# Patient Record
Sex: Female | Born: 1950 | Race: White | Hispanic: No | Marital: Married | Smoking: Former smoker
Health system: Southern US, Community
[De-identification: ages and names within clinical notes are randomized; demographics above are authoritative.]

## PROBLEM LIST (undated history)

## (undated) DIAGNOSIS — F32A Depression, unspecified: Secondary | ICD-10-CM

## (undated) DIAGNOSIS — F329 Major depressive disorder, single episode, unspecified: Secondary | ICD-10-CM

## (undated) DIAGNOSIS — E785 Hyperlipidemia, unspecified: Secondary | ICD-10-CM

## (undated) DIAGNOSIS — I10 Essential (primary) hypertension: Secondary | ICD-10-CM

## (undated) DIAGNOSIS — E119 Type 2 diabetes mellitus without complications: Secondary | ICD-10-CM

## (undated) DIAGNOSIS — M1990 Unspecified osteoarthritis, unspecified site: Secondary | ICD-10-CM

## (undated) HISTORY — PX: ABDOMINAL HYSTERECTOMY: SHX81

## (undated) HISTORY — DX: Hyperlipidemia, unspecified: E78.5

## (undated) HISTORY — DX: Unspecified osteoarthritis, unspecified site: M19.90

## (undated) HISTORY — DX: Type 2 diabetes mellitus without complications: E11.9

## (undated) HISTORY — PX: BACK SURGERY: SHX140

## (undated) HISTORY — DX: Depression, unspecified: F32.A

## (undated) HISTORY — DX: Essential (primary) hypertension: I10

---
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
## 2019-11-07 ENCOUNTER — Encounter: Payer: Medicare Other | Attending: Internal Medicine | Admitting: *Deleted

## 2019-11-07 ENCOUNTER — Encounter: Payer: Self-pay | Admitting: *Deleted

## 2019-11-07 ENCOUNTER — Other Ambulatory Visit: Payer: Self-pay

## 2019-11-07 VITALS — BP 150/84 | Ht 62.0 in | Wt 242.5 lb

## 2019-11-07 DIAGNOSIS — I10 Essential (primary) hypertension: Secondary | ICD-10-CM | POA: Diagnosis not present

## 2019-11-07 DIAGNOSIS — Z713 Dietary counseling and surveillance: Secondary | ICD-10-CM | POA: Insufficient documentation

## 2019-11-07 DIAGNOSIS — Z6841 Body Mass Index (BMI) 40.0 and over, adult: Secondary | ICD-10-CM | POA: Insufficient documentation

## 2019-11-07 DIAGNOSIS — F3341 Major depressive disorder, recurrent, in partial remission: Secondary | ICD-10-CM | POA: Diagnosis not present

## 2019-11-07 DIAGNOSIS — E1165 Type 2 diabetes mellitus with hyperglycemia: Secondary | ICD-10-CM | POA: Insufficient documentation

## 2019-11-07 DIAGNOSIS — E119 Type 2 diabetes mellitus without complications: Secondary | ICD-10-CM

## 2019-11-07 DIAGNOSIS — M159 Polyosteoarthritis, unspecified: Secondary | ICD-10-CM | POA: Diagnosis not present

## 2019-11-07 DIAGNOSIS — E1169 Type 2 diabetes mellitus with other specified complication: Secondary | ICD-10-CM | POA: Diagnosis not present

## 2019-11-07 NOTE — Patient Instructions (Signed)
Check blood sugars 1-2 x day before breakfast and 2 hrs after one meal 3 x week Bring blood sugar records to the next class  Call your doctor for a prescription for:  1. Meter strips (type) One Touch Verio     checking  3 times per week  2. Lancets (type) One Touch Delica Plus checking  3  times per week  Exercise:  Walk as tolerated   Eat 3 meals day,  1-2 snacks a day Space meals 4-6 hours apart Don't skip meals Limit desserts/sweets and fried foods  Make an eye doctor appointment  Return for classes on:

## 2019-11-08 ENCOUNTER — Encounter: Payer: Self-pay | Admitting: *Deleted

## 2019-11-08 NOTE — Progress Notes (Signed)
Diabetes Self-Management Education  Visit Type: First/Initial  Appt. Start Time:  1345 Appt. End Time: 6144  11/07/2019  April Richardson, identified by name and date of birth, is a 68 y.o. female with a diagnosis of Diabetes: Type 2.   ASSESSMENT  Blood pressure (!) 150/84, height 5\' 2"  (1.575 m), weight 242 lb 8 oz (110 kg). Body mass index is 44.35 kg/m.  Diabetes Self-Management Education - 11/07/19 1738      Visit Information   Visit Type  First/Initial      Initial Visit   Diabetes Type  Type 2    Are you currently following a meal plan?  No    Are you taking your medications as prescribed?  Yes    Date Diagnosed  15 years      Health Coping   How would you rate your overall health?  Fair      Psychosocial Assessment   Patient Belief/Attitude about Diabetes  Other (comment)   "I hate it"   Self-care barriers  None    Self-management support  Doctor's office;Family    Other persons present  Family Member   daughter   Patient Concerns  Nutrition/Meal planning;Medication;Monitoring;Healthy Lifestyle;Glycemic Control;Weight Control    Special Needs  None    Preferred Learning Style  Auditory    Learning Readiness  Ready    How often do you need to have someone help you when you read instructions, pamphlets, or other written materials from your doctor or pharmacy?  1 - Never    What is the last grade level you completed in school?  high school      Pre-Education Assessment   Patient understands the diabetes disease and treatment process.  Needs Instruction    Patient understands incorporating nutritional management into lifestyle.  Needs Instruction    Patient undertands incorporating physical activity into lifestyle.  Needs Instruction    Patient understands using medications safely.  Needs Instruction    Patient understands monitoring blood glucose, interpreting and using results  Needs Instruction    Patient understands prevention, detection, and  treatment of acute complications.  Needs Instruction    Patient understands prevention, detection, and treatment of chronic complications.  Needs Instruction    Patient understands how to develop strategies to address psychosocial issues.  Needs Instruction    Patient understands how to develop strategies to promote health/change behavior.  Needs Instruction      Complications   Last HgB A1C per patient/outside source  6.2 %   10/14/2019   How often do you check your blood sugar?  0 times/day (not testing)   Provided One Touch Verio Flex and instructed on use. BG upon return demonstration was 77 mg/dL at 2:55 pm - 6 hrs pp   Have you had a dilated eye exam in the past 12 months?  No    Have you had a dental exam in the past 12 months?  No    Are you checking your feet?  No      Dietary Intake   Breakfast  eats at 12 noon - cereal and milk; [place] yogurt and granola; egg and toast; occasional pancake    Snack (afternoon)  fruit - apple, banana, grapes, pineapple, mango, kiwi    Dinner  2-3 x week Chipotle, Chick-fil-a, Taco [employer]; occasional sanwich; chicken salad; chicken and occasional fish with potaotes, peas, beans, corn, rice, pasta, green beans, salad with tomatoes, cukes, cauliflower, brussel sprouts    Beverage(s)  water, coffee  Exercise   Exercise Type  ADL's      Patient Education   Previous Diabetes Education  Yes (please comment)   15 years ago   Disease state   Definition of diabetes, type 1 and 2, and the diagnosis of diabetes;Explored patient's options for treatment of their diabetes    Nutrition management   Role of diet in the treatment of diabetes and the relationship between the three main macronutrients and blood glucose level;Food label reading, portion sizes and measuring food.;Reviewed blood glucose goals for pre and post meals and how to evaluate the patients' food intake on their blood glucose level.    Physical activity and exercise   Role of exercise on  diabetes management, blood pressure control and cardiac health.    Medications  Reviewed patients medication for diabetes, action, purpose, timing of dose and side effects.    Monitoring  Taught/evaluated SMBG meter.;Purpose and frequency of SMBG.;Taught/discussed recording of test results and interpretation of SMBG.;Identified appropriate SMBG and/or A1C goals.    Chronic complications  Relationship between chronic complications and blood glucose control    Psychosocial adjustment  Identified and addressed patients feelings and concerns about diabetes      Individualized Goals (developed by patient)   Reducing Risk  Improve blood sugars Decrease medications Prevent diabetes complications Lose weight Lead a healthier lifestyle Become more fit     Outcomes   Expected Outcomes  Demonstrated interest in learning. Expect positive outcomes    Future DMSE  2 wks       Individualized Plan for Diabetes Self-Management Training:   Learning Objective:  Patient will have a greater understanding of diabetes self-management. Patient education plan is to attend individual and/or group sessions per assessed needs and concerns.   Plan:   Patient Instructions  Check blood sugars 1-2 x day before breakfast and 2 hrs after one meal 3 x week Bring blood sugar records to the next class Call your doctor for a prescription for:  1. Meter strips (type) One Touch Verio     checking  3 times per week  2. Lancets (type) One Touch Delica Plus checking  3  times per week Exercise:  Walk as tolerated  Eat 3 meals day,  1-2 snacks a day Space meals 4-6 hours apart Don't skip meals Limit desserts/sweets and fried foods Make an eye doctor appointment  Expected Outcomes:  Demonstrated interest in learning. Expect positive outcomes  Education material provided:  General Meal Planning Guidelines Simple Meal Plan Meter = One Touch Verio Flex  If problems or questions, patient to contact team via:  Sharion Settler, RN, CCM, CDE [phone]  Future DSME appointment: 2 wks  November 28, 2019 for Diabetes Class 1

## 2019-11-28 ENCOUNTER — Encounter: Payer: Medicare Other | Attending: Internal Medicine | Admitting: Dietician

## 2019-11-28 ENCOUNTER — Encounter: Payer: Self-pay | Admitting: Dietician

## 2019-11-28 ENCOUNTER — Other Ambulatory Visit: Payer: Self-pay

## 2019-11-28 VITALS — Ht 62.0 in | Wt 243.6 lb

## 2019-11-28 DIAGNOSIS — I10 Essential (primary) hypertension: Secondary | ICD-10-CM | POA: Insufficient documentation

## 2019-11-28 DIAGNOSIS — M159 Polyosteoarthritis, unspecified: Secondary | ICD-10-CM | POA: Insufficient documentation

## 2019-11-28 DIAGNOSIS — Z713 Dietary counseling and surveillance: Secondary | ICD-10-CM | POA: Diagnosis present

## 2019-11-28 DIAGNOSIS — E119 Type 2 diabetes mellitus without complications: Secondary | ICD-10-CM

## 2019-11-28 DIAGNOSIS — Z6841 Body Mass Index (BMI) 40.0 and over, adult: Secondary | ICD-10-CM | POA: Insufficient documentation

## 2019-11-28 DIAGNOSIS — E1165 Type 2 diabetes mellitus with hyperglycemia: Secondary | ICD-10-CM | POA: Diagnosis not present

## 2019-11-28 DIAGNOSIS — E1169 Type 2 diabetes mellitus with other specified complication: Secondary | ICD-10-CM | POA: Insufficient documentation

## 2019-11-28 DIAGNOSIS — F3341 Major depressive disorder, recurrent, in partial remission: Secondary | ICD-10-CM | POA: Insufficient documentation

## 2019-11-28 NOTE — Progress Notes (Signed)
Appt. Start Time: 1800 Appt. End Time: 2030  Class 1 Diabetes Overview - define DM; state own type of DM; identify functions of pancreas and insulin; define insulin deficiency vs insulin resistance  Psychosocial - identify DM as a source of stress; state the effects of stress on BG control  Nutritional Management - describe effects of food on blood glucose; identify sources of carbohydrate, protein and fat; verbalize the importance of balance meals in controlling blood glucose  Exercise - describe the effects of exercise on blood glucose and importance of regular exercise in controlling diabetes; state a plan for personal exercise; verbalize contraindications for exercise  Self-Monitoring - state importance of SMBG; use SMBG results to effectively manage diabetes; identify importance of regular HbA1C testing and goals for results  Acute Complications - recognize hyperglycemia and hypoglycemia with causes and effects; identify blood glucose results as high, low or in control; list steps in treating and preventing high and low blood glucose  Chronic Complications/Foot, Skin, Eye Dental Care - identify possible long-term complications of diabetes (retinopathy, neuropathy, nephropathy, cardiovascular disease, infections); state importance of daily self-foot exams; describe how to examine feet and what to look for; explain appropriate eye and dental care  Lifestyle Changes/Goals & Health/Community Resources - state benefits of making appropriate lifestyle changes; identify habits that need to change (meals, tobacco, alcohol); identify strategies to reduce risk factors for personal health  Pregnancy/Sexual Health - define gestational diabetes; state importance of good blood glucose control and birth control prior to pregnancy; state importance of good blood glucose control in preventing sexual problems (impotence, vaginal dryness, infections, loss of desire); state relationship of blood glucose control  and pregnancy outcome; describe risk of maternal and fetal complications  Teaching Materials Used: Class 1 Slides/Notebook Diabetes Booklet ID Card  Medic Alert/Medic ID Forms Sleep Evaluation Exercise Handout Planning a Balanced Meal Goals for Class 1

## 2019-12-05 ENCOUNTER — Encounter: Payer: Self-pay | Admitting: Dietician

## 2019-12-05 ENCOUNTER — Encounter: Payer: Medicare Other | Admitting: Dietician

## 2019-12-05 ENCOUNTER — Other Ambulatory Visit: Payer: Self-pay

## 2019-12-05 VITALS — Wt 245.3 lb

## 2019-12-05 DIAGNOSIS — Z713 Dietary counseling and surveillance: Secondary | ICD-10-CM | POA: Diagnosis not present

## 2019-12-05 DIAGNOSIS — E119 Type 2 diabetes mellitus without complications: Secondary | ICD-10-CM

## 2019-12-05 NOTE — Progress Notes (Signed)

## 2019-12-12 ENCOUNTER — Ambulatory Visit: Payer: Medicare Other

## 2020-01-09 ENCOUNTER — Other Ambulatory Visit: Payer: Self-pay

## 2020-01-09 ENCOUNTER — Telehealth: Payer: Self-pay | Admitting: *Deleted

## 2020-01-09 NOTE — Telephone Encounter (Signed)
Patient's daughter called in to front desk and cancelled Class 3 for tonight stating COVID numbers were still high. She was given dates for Class 3 in Feb and March and instructed to call back if she could come to one of these.

## 2020-02-07 ENCOUNTER — Encounter: Payer: Self-pay | Admitting: *Deleted

## 2020-07-05 NOTE — Progress Notes (Signed)
07/06/2020 2:47 PM   April Richardson June 01, 1951 [id number]  Referring provider: Barbette Reichmann, MD [address] [facility] [place],  [place] [address] Chief Complaint  Patient presents with  . Hematuria    HPI: April Richardson is a 69 y.o. female who is seen for an evaluation and management of hematuria. She presents today with her daughter.   -Patient had a follow up with her PCP on 06/15/2020.  -Labs prior on 06/11/2020 showed moderate hematuria, large leukocyte esterace, WBC 10-50, RBC 10-50, many bacteria and squamous epithelial cells. Associated urine culture showed mixed urogenital flora.  -In the past she was treated for UTIs with antibiotics and the symptoms did not completely resolve. -She has some lower back pain.     PMH: Past Medical History:  Diagnosis Date  . Arthritis   . Depression   . Diabetes mellitus without complication (HCC)   . Hyperlipidemia   . Hypertension     Surgical History: Past Surgical History:  Procedure Laterality Date  . ABDOMINAL HYSTERECTOMY    . BACK SURGERY    . CESAREAN SECTION      Home Medications:  Allergies as of 07/06/2020   No Known Allergies     Medication List       Accurate as of July 06, 2020  2:47 PM. If you have any questions, ask your nurse or doctor.        amLODipine 10 MG tablet Commonly known as: NORVASC Take 10 mg by mouth daily.   aspirin EC 81 MG tablet Take 80 mg by mouth daily as needed.   carvedilol 25 MG tablet Commonly known as: COREG Take 25 mg by mouth 2 (two) times daily.   ezetimibe 10 MG tablet Commonly known as: ZETIA Take 10 mg by mouth daily.   Ferrex 150 150 MG capsule Generic drug: iron polysaccharides Take 150 mg by mouth daily.   meloxicam 15 MG tablet Commonly known as: MOBIC Take 15 mg by mouth daily.   metFORMIN 500 MG tablet Commonly known as: GLUCOPHAGE Take 1,000 mg by mouth 2 (two) times daily with a meal.   pioglitazone  15 MG tablet Commonly known as: ACTOS Take 15 mg by mouth daily.   valsartan 80 MG tablet Commonly known as: DIOVAN Take 80 mg by mouth daily.   venlafaxine XR 150 MG 24 hr capsule Commonly known as: EFFEXOR-XR Take 150 mg by mouth daily with breakfast.   Victoza 18 MG/3ML Sopn Generic drug: liraglutide Inject 18 mg into the skin daily.   Vitamin D-1000 Max St 25 MCG (1000 UT) tablet Generic drug: Cholecalciferol Take 5,000 Units by mouth daily.       Allergies: No Known Allergies  Family History: Family History  Problem Relation Age of Onset  . Diabetes Mother     Social History:  reports that she quit smoking about 11 years ago. Her smoking use included cigarettes. She has a 15.00 pack-year smoking history. She has never used smokeless tobacco. She reports previous alcohol use. No history on file for drug use.   Physical Exam: BP (!) 147/79   Pulse 71   Ht 5\' 2"  (1.575 m)   Wt 219 lb (99.3 kg)   BMI 40.06 kg/m   Constitutional:  Alert and oriented, No acute distress. HEENT: [place] AT, moist mucus membranes.  Trachea midline, no masses. Cardiovascular: No clubbing, cyanosis, or edema. Respiratory: Normal respiratory effort, no increased work of breathing. Skin: No rashes, bruises or suspicious lesions. Neurologic: Grossly  intact, no focal deficits, moving all 4 extremities. Psychiatric: Normal mood and affect.  Urinalysis Microscopy 11-30 WBC/3-10 RBC/>10 squamous epithelial cells  Assessment & Plan:    1. Asymptomatic pyuria -Stable bothersome symptoms. -Multiple prior urinalysis showed significant squamous epithelial cells consistent with vaginal contamination.  -PVR is 12 mL.  -Screening RUS   -Daughter inquired about supplements for urinary tract health and discussed cranberry tablets and d-mannose.   [facility] [address], [address] [place], [place] [address] [phone]  I, April Richardson, am acting as a scribe  for Dr. Lorin Picket C. Faith Branan,  I have reviewed the above documentation for accuracy and completeness, and I agree with the above.   Riki Altes, MD

## 2020-07-06 ENCOUNTER — Encounter: Payer: Self-pay | Admitting: Urology

## 2020-07-06 ENCOUNTER — Ambulatory Visit: Payer: Medicare HMO | Admitting: Urology

## 2020-07-06 ENCOUNTER — Other Ambulatory Visit: Payer: Self-pay

## 2020-07-06 VITALS — BP 147/79 | HR 71 | Ht 62.0 in | Wt 219.0 lb

## 2020-07-06 DIAGNOSIS — R8281 Pyuria: Secondary | ICD-10-CM

## 2020-07-06 DIAGNOSIS — R310 Gross hematuria: Secondary | ICD-10-CM

## 2020-07-06 LAB — BLADDER SCAN AMB NON-IMAGING: SCA Result: 12

## 2020-07-07 LAB — URINALYSIS, COMPLETE
Bilirubin, UA: NEGATIVE
Glucose, UA: NEGATIVE
Nitrite, UA: NEGATIVE
RBC, UA: NEGATIVE
Specific Gravity, UA: 1.025 (ref 1.005–1.030)
Urobilinogen, Ur: 0.2 mg/dL (ref 0.2–1.0)
pH, UA: 5 (ref 5.0–7.5)

## 2020-07-07 LAB — MICROSCOPIC EXAMINATION
Bacteria, UA: NONE SEEN
Epithelial Cells (non renal): >10 /hpf — AB (ref 0–10)

## 2020-07-08 ENCOUNTER — Encounter: Payer: Self-pay | Admitting: Urology

## 2020-08-31 LAB — COLOGUARD: COLOGUARD: NEGATIVE

## 2020-11-30 ENCOUNTER — Emergency Department: Payer: Medicare HMO

## 2020-11-30 ENCOUNTER — Encounter: Payer: Self-pay | Admitting: Emergency Medicine

## 2020-11-30 ENCOUNTER — Other Ambulatory Visit: Payer: Self-pay

## 2020-11-30 ENCOUNTER — Emergency Department
Admission: EM | Admit: 2020-11-30 | Discharge: 2020-11-30 | Disposition: A | Discharge status: Home / Self Care | Payer: Medicare HMO | Attending: Emergency Medicine | Admitting: Emergency Medicine

## 2020-11-30 DIAGNOSIS — R030 Elevated blood-pressure reading, without diagnosis of hypertension: Secondary | ICD-10-CM | POA: Diagnosis not present

## 2020-11-30 DIAGNOSIS — M549 Dorsalgia, unspecified: Secondary | ICD-10-CM | POA: Insufficient documentation

## 2020-11-30 DIAGNOSIS — Z5321 Procedure and treatment not carried out due to patient leaving prior to being seen by health care provider: Secondary | ICD-10-CM | POA: Insufficient documentation

## 2020-11-30 DIAGNOSIS — R519 Headache, unspecified: Secondary | ICD-10-CM | POA: Insufficient documentation

## 2020-11-30 LAB — BASIC METABOLIC PANEL
Anion gap: 13 (ref 5–15)
BUN: 14 mg/dL (ref 8–23)
CO2: 27 mmol/L (ref 22–32)
Calcium: 10.3 mg/dL (ref 8.9–10.3)
Chloride: 98 mmol/L (ref 98–111)
Creatinine, Ser: 0.78 mg/dL (ref 0.44–1.00)
GFR, Estimated: >60 mL/min (ref >60)
Glucose, Bld: 144 mg/dL — ABNORMAL HIGH (ref 70–99)
Potassium: 3.6 mmol/L (ref 3.5–5.1)
Sodium: 138 mmol/L (ref 135–145)

## 2020-11-30 LAB — CBC
HCT: 41.3 % (ref 36.0–46.0)
Hemoglobin: 13.4 g/dL (ref 12.0–15.0)
MCH: 28.3 pg (ref 26.0–34.0)
MCHC: 32.4 g/dL (ref 30.0–36.0)
MCV: 87.1 fL (ref 80.0–100.0)
Platelets: 381 10*3/uL (ref 150–400)
RBC: 4.74 MIL/uL (ref 3.87–5.11)
RDW: 13.5 % (ref 11.5–15.5)
WBC: 15.8 10*3/uL — ABNORMAL HIGH (ref 4.0–10.5)
nRBC: 0 % (ref 0.0–0.2)

## 2020-11-30 LAB — TROPONIN I (HIGH SENSITIVITY): Troponin I (High Sensitivity): 5 ng/L (ref <18)

## 2020-11-30 NOTE — ED Triage Notes (Signed)
Pt to ED via [facility]. Pt states that she has been having back pain for 2 weeks. Last night she started feeling like she was having indigestion, pt states that she woke up with her chest hurting and feeling heavy. Pt states that during the night she got really hot. Pt states that she got up 3-4 times last night to pee. Pt states that when she woke up this morning she had a headache and was exhausted. Pt states that [facility] told her that her blood pressure was very elevated.   Pt states that she is not currently having chest pain. Pt states that the pain in her back comes and goes. Pt does c/o headache at this time.

## 2020-12-03 ENCOUNTER — Encounter: Payer: Self-pay | Admitting: Emergency Medicine

## 2020-12-03 ENCOUNTER — Emergency Department: Payer: Medicare HMO

## 2020-12-03 ENCOUNTER — Other Ambulatory Visit: Payer: Self-pay

## 2020-12-03 ENCOUNTER — Emergency Department
Admission: RE | Admit: 2020-12-03 | Discharge: 2020-12-03 | Disposition: A | Discharge status: Home / Self Care | Payer: Medicare HMO | Source: Ambulatory Visit | Attending: Family Medicine | Admitting: Family Medicine

## 2020-12-03 ENCOUNTER — Emergency Department
Admission: EM | Admit: 2020-12-03 | Discharge: 2020-12-04 | Discharge status: Against Medical Advice | Payer: Medicare HMO | Attending: Student in an Organized Health Care Education/Training Program | Admitting: Student in an Organized Health Care Education/Training Program

## 2020-12-03 ENCOUNTER — Telehealth: Payer: Self-pay | Admitting: Emergency Medicine

## 2020-12-03 ENCOUNTER — Other Ambulatory Visit: Payer: Self-pay | Admitting: Family Medicine

## 2020-12-03 DIAGNOSIS — D72829 Elevated white blood cell count, unspecified: Secondary | ICD-10-CM | POA: Insufficient documentation

## 2020-12-03 DIAGNOSIS — R509 Fever, unspecified: Secondary | ICD-10-CM | POA: Diagnosis not present

## 2020-12-03 DIAGNOSIS — R109 Unspecified abdominal pain: Secondary | ICD-10-CM | POA: Diagnosis present

## 2020-12-03 DIAGNOSIS — R1011 Right upper quadrant pain: Secondary | ICD-10-CM

## 2020-12-03 LAB — URINALYSIS, COMPLETE (UACMP) WITH MICROSCOPIC
Bacteria, UA: NONE SEEN
Bilirubin Urine: NEGATIVE
Glucose, UA: NEGATIVE mg/dL
Hgb urine dipstick: NEGATIVE
Ketones, ur: NEGATIVE mg/dL
Nitrite: NEGATIVE
Protein, ur: NEGATIVE mg/dL
Specific Gravity, Urine: 1.036 — ABNORMAL HIGH (ref 1.005–1.030)
pH: 5 (ref 5.0–8.0)

## 2020-12-03 LAB — CBC
HCT: 36.1 % (ref 36.0–46.0)
Hemoglobin: 11.7 g/dL — ABNORMAL LOW (ref 12.0–15.0)
MCH: 28 pg (ref 26.0–34.0)
MCHC: 32.4 g/dL (ref 30.0–36.0)
MCV: 86.4 fL (ref 80.0–100.0)
Platelets: 340 10*3/uL (ref 150–400)
RBC: 4.18 MIL/uL (ref 3.87–5.11)
RDW: 13.7 % (ref 11.5–15.5)
WBC: 6.5 10*3/uL (ref 4.0–10.5)
nRBC: 0 % (ref 0.0–0.2)

## 2020-12-03 LAB — COMPREHENSIVE METABOLIC PANEL
ALT: 154 U/L — ABNORMAL HIGH (ref 0–44)
AST: 185 U/L — ABNORMAL HIGH (ref 15–41)
Albumin: 3.2 g/dL — ABNORMAL LOW (ref 3.5–5.0)
Alkaline Phosphatase: 63 U/L (ref 38–126)
Anion gap: 12 (ref 5–15)
BUN: 26 mg/dL — ABNORMAL HIGH (ref 8–23)
CO2: 21 mmol/L — ABNORMAL LOW (ref 22–32)
Calcium: 9.4 mg/dL (ref 8.9–10.3)
Chloride: 97 mmol/L — ABNORMAL LOW (ref 98–111)
Creatinine, Ser: 1.01 mg/dL — ABNORMAL HIGH (ref 0.44–1.00)
GFR, Estimated: >60 mL/min (ref >60)
Glucose, Bld: 133 mg/dL — ABNORMAL HIGH (ref 70–99)
Potassium: 3.7 mmol/L (ref 3.5–5.1)
Sodium: 130 mmol/L — ABNORMAL LOW (ref 135–145)
Total Bilirubin: 2.8 mg/dL — ABNORMAL HIGH (ref 0.3–1.2)
Total Protein: 8 g/dL (ref 6.5–8.1)

## 2020-12-03 LAB — LIPASE, BLOOD: Lipase: 33 U/L (ref 11–51)

## 2020-12-03 MED ORDER — IOHEXOL 300 MG/ML  SOLN
125.0000 mL | Freq: Once | INTRAMUSCULAR | Status: AC | PRN
  Administered 2020-12-03: 125 mL via INTRAVENOUS

## 2020-12-03 NOTE — Telephone Encounter (Signed)
Called patient due to left emergency department before provider exam to inquire about condition and follow up plans. Daughter Fredric Mare says patient has appt today with pcp.

## 2020-12-03 NOTE — ED Triage Notes (Signed)
Pt via pov from home with cholecystitis. PCP ordered a CT scan this morning and it was positive for stone in duct, so she was sent here to start the process. Pt alert & oriented, nad noted.

## 2020-12-04 ENCOUNTER — Other Ambulatory Visit: Payer: Self-pay | Admitting: Family Medicine

## 2020-12-04 ENCOUNTER — Other Ambulatory Visit (HOSPITAL_COMMUNITY): Payer: Self-pay | Admitting: Family Medicine

## 2020-12-04 DIAGNOSIS — R911 Solitary pulmonary nodule: Secondary | ICD-10-CM

## 2020-12-04 DIAGNOSIS — R0602 Shortness of breath: Secondary | ICD-10-CM

## 2020-12-04 DIAGNOSIS — R1011 Right upper quadrant pain: Secondary | ICD-10-CM

## 2020-12-04 NOTE — ED Notes (Signed)
No answer when called several times from lobby 

## 2020-12-12 ENCOUNTER — Ambulatory Visit: Payer: Medicare HMO

## 2021-01-01 ENCOUNTER — Other Ambulatory Visit: Payer: Self-pay | Admitting: Pulmonary Disease

## 2021-01-01 DIAGNOSIS — R918 Other nonspecific abnormal finding of lung field: Secondary | ICD-10-CM

## 2021-01-16 ENCOUNTER — Ambulatory Visit
Admission: RE | Admit: 2021-01-16 | Discharge: 2021-01-16 | Disposition: A | Discharge status: Home / Self Care | Payer: Medicare HMO | Source: Ambulatory Visit | Attending: Pulmonary Disease | Admitting: Pulmonary Disease

## 2021-01-16 ENCOUNTER — Other Ambulatory Visit: Payer: Self-pay

## 2021-01-16 DIAGNOSIS — R918 Other nonspecific abnormal finding of lung field: Secondary | ICD-10-CM | POA: Insufficient documentation

## 2021-01-16 LAB — POCT I-STAT CREATININE: Creatinine, Ser: 1 mg/dL (ref 0.44–1.00)

## 2021-01-16 MED ORDER — IOHEXOL 300 MG/ML  SOLN
75.0000 mL | Freq: Once | INTRAMUSCULAR | Status: AC | PRN
  Administered 2021-01-16: 75 mL via INTRAVENOUS

## 2021-01-18 ENCOUNTER — Other Ambulatory Visit: Payer: Self-pay | Admitting: Internal Medicine

## 2021-01-18 ENCOUNTER — Other Ambulatory Visit (HOSPITAL_COMMUNITY): Payer: Self-pay | Admitting: Internal Medicine

## 2021-01-18 DIAGNOSIS — R0602 Shortness of breath: Secondary | ICD-10-CM

## 2021-01-18 DIAGNOSIS — K810 Acute cholecystitis: Secondary | ICD-10-CM

## 2021-01-18 DIAGNOSIS — R911 Solitary pulmonary nodule: Secondary | ICD-10-CM

## 2021-04-02 ENCOUNTER — Other Ambulatory Visit: Payer: Self-pay | Admitting: Internal Medicine

## 2021-04-02 DIAGNOSIS — R918 Other nonspecific abnormal finding of lung field: Secondary | ICD-10-CM

## 2021-04-22 ENCOUNTER — Ambulatory Visit
Admission: RE | Admit: 2021-04-22 | Discharge: 2021-04-22 | Disposition: A | Discharge status: Home / Self Care | Payer: Medicare HMO | Source: Ambulatory Visit | Attending: Internal Medicine | Admitting: Internal Medicine

## 2021-04-22 ENCOUNTER — Other Ambulatory Visit: Payer: Self-pay

## 2021-04-22 DIAGNOSIS — R918 Other nonspecific abnormal finding of lung field: Secondary | ICD-10-CM

## 2021-04-22 MED ORDER — FLUDEOXYGLUCOSE F - 18 (FDG) INJECTION: 11.2000 | Freq: Once | INTRAVENOUS | Status: DC | PRN

## 2021-05-06 ENCOUNTER — Ambulatory Visit: Admission: RE | Admit: 2021-05-06 | Payer: Medicare HMO | Source: Ambulatory Visit

## 2021-05-16 ENCOUNTER — Ambulatory Visit: Admission: RE | Admit: 2021-05-16 | Payer: Medicare HMO | Source: Ambulatory Visit

## 2021-05-27 ENCOUNTER — Other Ambulatory Visit: Payer: Self-pay

## 2021-05-27 ENCOUNTER — Ambulatory Visit
Admission: RE | Admit: 2021-05-27 | Discharge: 2021-05-27 | Disposition: A | Discharge status: Home / Self Care | Payer: Medicare HMO | Source: Ambulatory Visit | Attending: Internal Medicine | Admitting: Internal Medicine

## 2021-05-27 DIAGNOSIS — K760 Fatty (change of) liver, not elsewhere classified: Secondary | ICD-10-CM | POA: Diagnosis not present

## 2021-05-27 DIAGNOSIS — I700 Atherosclerosis of aorta: Secondary | ICD-10-CM | POA: Diagnosis not present

## 2021-05-27 DIAGNOSIS — N281 Cyst of kidney, acquired: Secondary | ICD-10-CM | POA: Insufficient documentation

## 2021-05-27 DIAGNOSIS — R918 Other nonspecific abnormal finding of lung field: Secondary | ICD-10-CM | POA: Diagnosis not present

## 2021-05-27 LAB — GLUCOSE, CAPILLARY: Glucose-Capillary: 171 mg/dL — ABNORMAL HIGH (ref 70–99)

## 2021-05-27 MED ORDER — FLUDEOXYGLUCOSE F - 18 (FDG) INJECTION
11.2000 | Freq: Once | INTRAVENOUS | Status: AC | PRN
  Administered 2021-05-27: 11.41 via INTRAVENOUS

## 2021-07-16 ENCOUNTER — Other Ambulatory Visit (HOSPITAL_COMMUNITY): Payer: Self-pay | Admitting: Internal Medicine

## 2021-07-16 ENCOUNTER — Other Ambulatory Visit: Payer: Self-pay | Admitting: Internal Medicine

## 2021-07-16 DIAGNOSIS — R911 Solitary pulmonary nodule: Secondary | ICD-10-CM

## 2021-09-27 ENCOUNTER — Ambulatory Visit: Payer: Medicare HMO | Admitting: Physical Therapy

## 2021-10-02 ENCOUNTER — Ambulatory Visit: Payer: Medicare HMO | Attending: Internal Medicine | Admitting: Physical Therapy

## 2021-10-02 ENCOUNTER — Ambulatory Visit: Payer: Medicare HMO | Admitting: Physical Therapy

## 2021-10-02 DIAGNOSIS — M25551 Pain in right hip: Secondary | ICD-10-CM | POA: Diagnosis not present

## 2021-10-02 NOTE — Therapy (Signed)
[facility] [address] [place], [address] Phone: [phone]   Fax:  [phone]  Physical Therapy Evaluation  Patient Details  Name: April Richardson MRN: [id number] Date of Birth: 02-Apr-1951 No data recorded  Encounter Date: 10/02/2021   PT End of Session - 10/02/21 1051     Visit Number 1    Number of Visits 17    Date for PT Re-Evaluation 11/27/21    Authorization - Visit Number 1    Progress Note Due on Visit 10    PT Start Time 0950    PT Stop Time 1030    PT Time Calculation (min) 40 min    Equipment Utilized During Treatment Gait belt    Activity Tolerance Patient tolerated treatment well    Behavior During Therapy WFL for tasks assessed/performed             Past Medical History:  Diagnosis Date   Arthritis    Depression    Diabetes mellitus without complication (HCC)    Hyperlipidemia    Hypertension     Past Surgical History:  Procedure Laterality Date   ABDOMINAL HYSTERECTOMY     BACK SURGERY     CESAREAN SECTION      There were no vitals filed for this visit.    Subjective Assessment - 10/02/21 0951     Subjective April Richardson is 70 y.o. female presenting to therapy with c/o R hip pain for the past 2 month. She reports increase in pain after visting her daughter and having to negotiate a lot of stairs. She reports having limitations with walking, standing, lifting, sitting, and balance. 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 She reports avoiding stairs, curbs, and laying on R hip due to aggravation. She reports her pain is 0/10 best and 6/10 at worst. She reports utilizing tylenol to alleviate her pain. Her goal for PT is to reduce her R hip pain and negotiate a flight of steps in order to visit her daughter. She denies falling in the last 6 months, any unexplained weight fluctuation, saddle paresthesia,  or unrelenting night pain at this time.    Pertinent History DM, HTN    Limitations Walking;Standing    How long can you sit comfortably? n/a    How long can you stand comfortably?    How long can you walk comfortably? 10 min    Diagnostic tests xrays    Patient Stated Goals pt reports wanting to climb a flight of stairs    Currently in Pain? Yes    Pain Score 0-No pain    Pain Location Hip    Pain Orientation Right    Pain Descriptors / Indicators Aching    Pain Type Chronic pain    Pain Onset More than a month ago    Pain Frequency Intermittent    Aggravating Factors  stairs, walking, standing    Pain Relieving Factors tylenol    Effect of Pain on Daily Activities limits ability to perform shopping and occupation              Lumbar AROM   WFL excepts flexion limited approximately 25%      HIP MMT R/L Flex 4- /4 Ext: 3/3 within available range  ADD: 4/4 ABD 4-/4- painful  IR: 3+/3+ painful within available range ER 3/3 painful within available range   Knee MMT R/L Flex 4-/4 Ext  4-/4   Hip AROM/PROM   Limited in all planes bilat;  primarily R hip  Flex approximately 25% limited IR approximately 75% limited painful  Ext approximately 90% limited  PROM > AROM in all planes   Knee AROM   WFL   PAM: A-P pain full grade 3 mobilization; LAD deferred   Special tests:   FABER: + FADIR: + Scour: +     Test & Measures:   5XSTS: 37 sec : deferred TUG: 23sec  Single leg stance: deferred     Observation:  Gait: wide BOS, decreased stance time R LE, L Trendelenburg,  L LE in ER through stance phase     Posture: increased lumbar lordosis, thoracic kyphosis, round bilateral shoulder, forward head   Sensation: appears intact bilaterally       Objective measurements completed on examination: See above findings.     Ther-Ex Seated Hip ABD RTB  3 x 8-10 reps Sit to stands 3 x 6-8 reps      PT reviewed the following HEP with patient  with patient able to demonstrate a set of the following with min cuing for correction needed. PT educated patient on parameters of therex (how/when to inc/decrease intensity, frequency, rep/set range, stretch hold time, and purpose of therex) with verbalized understanding.             PT Education - 10/02/21 1033     Education Details Patient was educated on diagnosis, anatomy and pathology involved, prognosis, role of PT, and was given an HEP, demonstrating exercise with proper form following verbal and tactile cues, and was given a paper hand out to continue exercise at home. Pt was educated on and agreed to plan of care.    Methods Explanation;Demonstration    Comprehension Verbalized understanding;Returned demonstration              PT Short Term Goals - 10/02/21 1033       PT SHORT TERM GOAL #1   Title Pt will demonstrate independence with HEP to improve hip function for increased ability to participate with ADLs    Baseline HEP given    Time 4    Period Weeks    Status New    Target Date 10/30/21               PT Long Term Goals - 10/02/21 1036       PT LONG TERM GOAL #1   Title Patient will increase FOTO score to 56 to demonstrate predicted increase in functional mobility to complete ADLs    Baseline 10/02/21: 33    Time 8    Period Weeks    Status New    Target Date 11/27/21      PT LONG TERM GOAL #2   Title Pt will perform 5XSTS within 30 seconds without UE support in order to demonstrate improved LE strength and decrease the likelihood of falling.    Baseline 10/02/21:37 sec with BUE    Time 8    Period Weeks    Status New    Target Date 11/27/21      PT LONG TERM GOAL #3   Title Pt will independently negotiate 4 6" stairs with reciprocal gait pattern, single UE support, pain no greater than 4/10 to demonstrate increased LE strength and decreased pain with functional activity.    Baseline 10/02/21 step to pattern with single UE support    Time  8    Period Weeks    Status New  Target Date 11/27/21      PT LONG TERM GOAL #4   Title Pt will increase gait speed in to >0.63m/s to demonstrate appropriate speed for limited community ambulation    Baseline deferred    Time 8    Period Weeks    Status New    Target Date 11/27/21      PT LONG TERM GOAL #5   Title Pt will improve single leg stance to 10 sec or greater in order to demonstrate increased LE strength, increased balance, and decrease likelihood of falling    Baseline deferred    Time 8    Period Weeks    Status New    Target Date 11/27/21                    Plan - 10/02/21 1118     Clinical Impression Statement Patient is a 70 y/o female with hx of OA, presenting today with R hip pain. Patient presents with impairments in LE strength, bilat hip A/PROM, abnormal gait, pain and decreased balance. Activity limitations in stair negotiation, prolonged walking and standing prohibiting participation in completing ADLs, occupational duties, and  recreational/community activities. Patient will benefit from skilled PT to address these impairments to reduce risk of falling.    Personal Factors and Comorbidities Age;Fitness;Sex;Comorbidity 2    Comorbidities DM, HTN, Obesity    Examination-Activity Limitations Transfers;Locomotion Level;Stairs;Stand;Carry    Examination-Participation Restrictions Occupation;Community Activity;Shop    Stability/Clinical Decision Making Stable/Uncomplicated    Clinical Decision Making Low    Rehab Potential Fair    PT Frequency 2x / week    PT Duration 8 weeks    PT Treatment/Interventions ADLs/Self Care Home Management;Cryotherapy;Electrical Stimulation;Moist Heat;Traction;Gait training;Stair training;Functional mobility training;Therapeutic activities;Neuromuscular re-education;Balance training;Therapeutic exercise;Patient/family education;Manual techniques;Passive range of motion;Joint Manipulations;Energy conservation;Dry  needling;Taping    PT Next Visit Plan manual therapy( LAD), , SLS    PT Home Exercise Plan seated hip abd, sit to stands    Consulted and Agree with Plan of Care Patient             Patient will benefit from skilled therapeutic intervention in order to improve the following deficits and impairments:  Abnormal gait, Decreased balance, Decreased endurance, Decreased mobility, Difficulty walking, Hypomobility, Obesity, Pain, Postural dysfunction, Impaired flexibility, Decreased strength, Decreased activity tolerance, Decreased range of motion, Improper body mechanics  Visit Diagnosis: Pain in right hip     Problem List There are no problems to display for this patient.   Hilda Lias DPT  Romilda Joy, SPT  Hilda Lias, PT 10/03/2021, 2:40 PM  [facility] PHYS[facility] AND SPORTS MEDICINE [address], [place], [address] Phone: [phone]   Fax:  [phone]  Name: April Richardson MRN: [id number] Date of Birth: 08/18/1951

## 2021-10-04 ENCOUNTER — Encounter: Payer: Self-pay | Admitting: Physical Therapy

## 2021-10-04 ENCOUNTER — Ambulatory Visit: Payer: Medicare HMO | Admitting: Physical Therapy

## 2021-10-04 ENCOUNTER — Other Ambulatory Visit: Payer: Self-pay

## 2021-10-04 DIAGNOSIS — M25551 Pain in right hip: Secondary | ICD-10-CM

## 2021-10-04 NOTE — Therapy (Signed)
[facility]INE [address], [place], [address] Phone: [phone]   Fax:  [phone]  Physical Therapy Treatment  Patient Details  Name: Alleyah Twombly MRN: [id number] Date of Birth: [birth year] No data recorded  Encounter Date: 10/04/2021     Past Medical History:  Diagnosis Date   Arthritis    Depression    Diabetes mellitus without complication (HCC)    Hyperlipidemia    Hypertension     Past Surgical History:  Procedure Laterality Date   ABDOMINAL HYSTERECTOMY     BACK SURGERY     CESAREAN SECTION      There were no vitals filed for this visit.    Therex    Nu Step L1 x 5 min Le only   : 10.6 sec = 0.94 m/s  Single Leg stance: <5 sec BLE  Seated ABD RTB 3 x 10 reps with cues for foot placement   Sit to Stand 1 x 6 reps with heavy UE use   Seated knee ext 1 x 6 reps #5 with   *attempts to arise from elevated mat table without UE use                                   PT Short Term Goals - 10/02/21 1033       PT SHORT TERM GOAL #1   Title Pt will demonstrate independence with HEP to improve hip function for increased ability to participate with ADLs    Baseline HEP given    Time 4    Period Weeks    Status New    Target Date 10/30/21               PT Long Term Goals - 10/02/21 1036       PT LONG TERM GOAL #1   Title Patient will increase FOTO score to 56 to demonstrate predicted increase in functional mobility to complete ADLs    Baseline 10/02/21: 33    Time 8    Period Weeks    Status New    Target Date 11/27/21      PT LONG TERM GOAL #2   Title Pt will perform 5XSTS within 30 seconds without UE support in order to demonstrate improved LE strength and decrease the likelihood of falling.    Baseline 10/02/21:37 sec with BUE    Time 8    Period Weeks    Status New    Target Date 11/27/21      PT LONG TERM GOAL #3    Title Pt will independently negotiate 4 6" stairs with reciprocal gait pattern, single UE support, pain no greater than 4/10 to demonstrate increased LE strength and decreased pain with functional activity.    Baseline 10/02/21 step to pattern with single UE support    Time 8    Period Weeks    Status New    Target Date 11/27/21      PT LONG TERM GOAL #4   Title Pt will increase gait speed in to >0.38m/s to demonstrate appropriate speed for limited community ambulation    Baseline deferred    Time 8    Period Weeks    Status New    Target Date 11/27/21      PT LONG TERM GOAL #5   Title Pt will improve single leg stance to 10 sec or greater  in order to demonstrate increased LE strength, increased balance, and decrease likelihood of falling    Baseline deferred    Time 8    Period Weeks    Status New    Target Date 11/27/21                     Patient will benefit from skilled therapeutic intervention in order to improve the following deficits and impairments:  Abnormal gait, Decreased balance, Decreased endurance, Decreased mobility, Difficulty walking, Hypomobility, Obesity, Pain, Postural dysfunction, Impaired flexibility, Decreased strength, Decreased activity tolerance, Decreased range of motion, Improper body mechanics  Visit Diagnosis: Pain in right hip     Problem List There are no problems to display for this patient.   Hilda Lias DPT Romilda Joy, SPT  Hilda Lias, PT 10/06/2021, 9:15 PM  [facility] PHYSICAL AND SPORTS MEDICINE [address], [place], [address] Phone: [phone]   Fax:  [phone]  Name: Stefan Karen MRN: [id number] Date of Birth: 24-Oct-1951

## 2021-10-08 ENCOUNTER — Ambulatory Visit: Payer: Medicare HMO | Admitting: Physical Therapy

## 2021-10-11 ENCOUNTER — Encounter: Payer: Self-pay | Admitting: Physical Therapy

## 2021-10-11 ENCOUNTER — Other Ambulatory Visit: Payer: Self-pay

## 2021-10-11 ENCOUNTER — Ambulatory Visit: Payer: Medicare HMO | Admitting: Physical Therapy

## 2021-10-11 DIAGNOSIS — M25551 Pain in right hip: Secondary | ICD-10-CM

## 2021-10-11 NOTE — Therapy (Signed)
[facility]ORTS MEDICINE [address] [place], [address] Phone: [phone]   Fax:  [phone]  Physical Therapy Treatment  Patient Details  Name: April Richardson MRN: [id number] Date of Birth: March 25, 1951 No data recorded  Encounter Date: 10/11/2021   PT End of Session - 10/11/21 1030     Visit Number 3    Number of Visits 17    Date for PT Re-Evaluation 11/27/21    Authorization - Visit Number 3    Progress Note Due on Visit 10    PT Start Time 1020    PT Stop Time 1058    PT Time Calculation (min) 38 min    Equipment Utilized During Treatment Gait belt    Activity Tolerance Patient tolerated treatment well    Behavior During Therapy WFL for tasks assessed/performed             Past Medical History:  Diagnosis Date   Arthritis    Depression    Diabetes mellitus without complication (HCC)    Hyperlipidemia    Hypertension     Past Surgical History:  Procedure Laterality Date   ABDOMINAL HYSTERECTOMY     BACK SURGERY     CESAREAN SECTION      There were no vitals filed for this visit.   Subjective Assessment - 10/11/21 1024     Subjective Pt reports her R hip pain is 0/10 on NPRs . She reports active participation with her HEP and feels shes making functional progress.    Pertinent History April Richardson is 70 y.o. female presenting to therapy with c/o R hip pain for the past 2 month. She reports increase in pain after visting her daughter and having to negotiate a lot of stairs. She reports having limitations with walking, standing, lifting, sitting, and balance. 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 She reports avoiding stairs, curbs, and laying on R hip due to aggravation. She reports her pain is 0/10 best and 6/10 at worst. She reports utilizing tylenol to alleviate her pain. Her goal for PT is to reduce her R hip pain and  negotiate a flight of steps in order to visit her daughter. She denies falling in the last 6 months, any unexplained weight fluctuation, saddle paresthesia, or unrelenting night pain at this time.    Limitations Walking;Standing    How long can you sit comfortably? n/a    How long can you stand comfortably?    How long can you walk comfortably? 10 min    Diagnostic tests xrays    Patient Stated Goals pt reports wanting to climb a flight of stairs               Therex      Nu Step L2 x 5 min Seat 6 LE only   Mini Squat with swiss ball on wall 2 x 6-8 reps with cues for depth    Side lying hip ABD 2 x 6-810 reps with tactile and verbal cues for proper technique with good carry over   Alternating toe tapping onto 8 inch step 3 x 30 sec without UE support for balance and endurance               PT Education - 10/11/21 1157     Education Details therex form/technique    Person(s) Educated Patient    Methods Explanation;Demonstration    Comprehension Verbalized understanding;Returned  demonstration              PT Short Term Goals - 10/02/21 1033       PT SHORT TERM GOAL #1   Title Pt will demonstrate independence with HEP to improve hip function for increased ability to participate with ADLs    Baseline HEP given    Time 4    Period Weeks    Status New    Target Date 10/30/21               PT Long Term Goals - 10/02/21 1036       PT LONG TERM GOAL #1   Title Patient will increase FOTO score to 56 to demonstrate predicted increase in functional mobility to complete ADLs    Baseline 10/02/21: 33    Time 8    Period Weeks    Status New    Target Date 11/27/21      PT LONG TERM GOAL #2   Title Pt will perform 5XSTS within 30 seconds without UE support in order to demonstrate improved LE strength and decrease the likelihood of falling.    Baseline 10/02/21:37 sec with BUE    Time 8    Period Weeks    Status New    Target Date 11/27/21       PT LONG TERM GOAL #3   Title Pt will independently negotiate 4 6" stairs with reciprocal gait pattern, single UE support, pain no greater than 4/10 to demonstrate increased LE strength and decreased pain with functional activity.    Baseline 10/02/21 step to pattern with single UE support    Time 8    Period Weeks    Status New    Target Date 11/27/21      PT LONG TERM GOAL #4   Title Pt will increase gait speed in to >0.9m/s to demonstrate appropriate speed for limited community ambulation    Baseline deferred    Time 8    Period Weeks    Status New    Target Date 11/27/21      PT LONG TERM GOAL #5   Title Pt will improve single leg stance to 10 sec or greater in order to demonstrate increased LE strength, increased balance, and decrease likelihood of falling    Baseline deferred    Time 8    Period Weeks    Status New    Target Date 11/27/21                   Plan - 10/11/21 1158     Clinical Impression Statement PT continued LE strengthening and balance training this session. Pt tolerated therex with reports of increased pain throughtout session. Pt is improving with LE strengthening and endurance, however, does continues to need frequent rest breaks. Pt will continue to benefit from skilled PT in order to address impairments and achieve set goals.    Personal Factors and Comorbidities Age;Fitness;Sex;Comorbidity 2    Comorbidities DM, HTN, Obesity    Examination-Activity Limitations Transfers;Locomotion Level;Stairs;Stand;Carry    Examination-Participation Restrictions Occupation;Community Activity;Shop    Stability/Clinical Decision Making Stable/Uncomplicated    Clinical Decision Making Low    Rehab Potential Fair    PT Frequency 2x / week    PT Duration 8 weeks    PT Treatment/Interventions ADLs/Self Care Home Management;Cryotherapy;Electrical Stimulation;Moist Heat;Traction;Gait training;Stair training;Functional mobility training;Therapeutic  activities;Neuromuscular re-education;Balance training;Therapeutic exercise;Patient/family education;Manual techniques;Passive range of motion;Joint Manipulations;Energy conservation;Dry needling;Taping    PT Next Visit  Plan manual therapy( LAD)    PT Home Exercise Plan seated hip abd, sit to stands    Consulted and Agree with Plan of Care Patient             Patient will benefit from skilled therapeutic intervention in order to improve the following deficits and impairments:  Abnormal gait, Decreased balance, Decreased endurance, Decreased mobility, Difficulty walking, Hypomobility, Obesity, Pain, Postural dysfunction, Impaired flexibility, Decreased strength, Decreased activity tolerance, Decreased range of motion, Improper body mechanics  Visit Diagnosis: Pain in right hip     Problem List There are no problems to display for this patient.    Hilda Lias DPT Romilda Joy, SPT  Hilda Lias, PT 10/11/2021, 12:04 PM  [facility] [facility] PHYSICAL AND SPORTS MEDICINE [address] [place], [address] Phone: [phone]   Fax:  [phone]  Name: Jyrah Blye MRN: [id number] Date of Birth: 07-29-1951

## 2021-10-15 ENCOUNTER — Ambulatory Visit: Payer: Medicare HMO | Admitting: Physical Therapy

## 2021-10-18 ENCOUNTER — Ambulatory Visit: Payer: Medicare HMO | Admitting: Physical Therapy

## 2021-10-18 ENCOUNTER — Other Ambulatory Visit: Payer: Self-pay

## 2021-10-18 DIAGNOSIS — M25551 Pain in right hip: Secondary | ICD-10-CM

## 2021-10-18 NOTE — Therapy (Signed)
[facility] PHYS[facility] SPORTS MEDICINE [address], [place], [address] Phone: [phone]   Fax:  [phone]  Physical Therapy Treatment  Patient Details  Name: April Richardson MRN: [id number] Date of Birth: 04-25-1951 No data recorded  Encounter Date: 10/18/2021   PT End of Session - 10/18/21 1024     Visit Number 4    Number of Visits 17    Date for PT Re-Evaluation 11/27/21    Authorization - Visit Number 4    Progress Note Due on Visit 10    PT Start Time 1016    PT Stop Time 1054    PT Time Calculation (min) 38 min    Equipment Utilized During Treatment Gait belt    Activity Tolerance Patient tolerated treatment well    Behavior During Therapy WFL for tasks assessed/performed             Past Medical History:  Diagnosis Date   Arthritis    Depression    Diabetes mellitus without complication (HCC)    Hyperlipidemia    Hypertension     Past Surgical History:  Procedure Laterality Date   ABDOMINAL HYSTERECTOMY     BACK SURGERY     CESAREAN SECTION      There were no vitals filed for this visit.   Subjective Assessment - 10/18/21 1020     Subjective Pt denies any R hip pain this visit. She reports active participation with her HEP and feels shes making progress.    Pertinent History April Richardson is 70 y.o. female presenting to therapy with c/o R hip pain for the past 2 month. She reports increase in pain after visting her daughter and having to negotiate a lot of stairs. She reports having limitations with walking, standing, lifting, sitting, and balance. 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 She reports avoiding stairs, curbs, and laying on R hip due to aggravation. She reports her pain is 0/10 best and 6/10 at worst. She reports utilizing tylenol to alleviate her pain. Her goal for PT is to reduce her R hip pain and negotiate a flight of  steps in order to visit her daughter. She denies falling in the last 6 months, any unexplained weight fluctuation, saddle paresthesia, or unrelenting night pain at this time.    Limitations Walking;Standing    How long can you sit comfortably? n/a    How long can you stand comfortably?    How long can you walk comfortably? 10 min    Diagnostic tests xrays    Patient Stated Goals pt reports wanting to climb a flight of stairs             Therex      Nu Step L2 x 5 min Seat 6 LE only >80SPM   Mini Squat with swiss ball on wall 2 x 10 reps with cues for depth and WB through R LE  Tandem Stance 3 x 20 sec each LE   Single Leg Stance each LE 20 sec without UE support each LE with CGA   Standing hip ABD 2 x 7-8 reps with tactile and verbal cues for proper technique with good carry over    Alternating toe tapping onto 8 inch step 2 x 30 sec without UE support for balance and endurance  PT Education - 10/18/21 1055     Education Details therex form/technique    Person(s) Educated Patient    Methods Explanation;Demonstration    Comprehension Returned demonstration;Verbalized understanding              PT Short Term Goals - 10/02/21 1033       PT SHORT TERM GOAL #1   Title Pt will demonstrate independence with HEP to improve hip function for increased ability to participate with ADLs    Baseline HEP given    Time 4    Period Weeks    Status New    Target Date 10/30/21               PT Long Term Goals - 10/02/21 1036       PT LONG TERM GOAL #1   Title Patient will increase FOTO score to 56 to demonstrate predicted increase in functional mobility to complete ADLs    Baseline 10/02/21: 33    Time 8    Period Weeks    Status New    Target Date 11/27/21      PT LONG TERM GOAL #2   Title Pt will perform 5XSTS within 30 seconds without UE support in order to demonstrate improved LE strength and decrease  the likelihood of falling.    Baseline 10/02/21:37 sec with BUE    Time 8    Period Weeks    Status New    Target Date 11/27/21      PT LONG TERM GOAL #3   Title Pt will independently negotiate 4 6" stairs with reciprocal gait pattern, single UE support, pain no greater than 4/10 to demonstrate increased LE strength and decreased pain with functional activity.    Baseline 10/02/21 step to pattern with single UE support    Time 8    Period Weeks    Status New    Target Date 11/27/21      PT LONG TERM GOAL #4   Title Pt will increase gait speed in to >0.16m/s to demonstrate appropriate speed for limited community ambulation    Baseline deferred    Time 8    Period Weeks    Status New    Target Date 11/27/21      PT LONG TERM GOAL #5   Title Pt will improve single leg stance to 10 sec or greater in order to demonstrate increased LE strength, increased balance, and decrease likelihood of falling    Baseline deferred    Time 8    Period Weeks    Status New    Target Date 11/27/21                   Plan - 10/18/21 1056     Clinical Impression Statement PT continued LE strengthening and balance training this session. Pt tolerated therex with no reports of increased pain throughtout session. Pt is improving with LE strengthening evidence by sit to stand without UE use. Pt  does continue to need frequent rest breaks. Pt will continue to benefit from skilled PT in order to address impairments and achieve set goals.    Personal Factors and Comorbidities Age;Fitness;Sex;Comorbidity 2    Comorbidities DM, HTN, Obesity    Examination-Activity Limitations Transfers;Locomotion Level;Stairs;Stand;Carry    Examination-Participation Restrictions Occupation;Community Activity;Shop    Stability/Clinical Decision Making Stable/Uncomplicated    Clinical Decision Making Low    Rehab Potential Fair    PT Frequency 2x / week  PT Duration 8 weeks    PT Treatment/Interventions  ADLs/Self Care Home Management;Cryotherapy;Electrical Stimulation;Moist Heat;Traction;Gait training;Stair training;Functional mobility training;Therapeutic activities;Neuromuscular re-education;Balance training;Therapeutic exercise;Patient/family education;Manual techniques;Passive range of motion;Joint Manipulations;Energy conservation;Dry needling;Taping    PT Next Visit Plan manual therapy( LAD)    PT Home Exercise Plan seated hip abd, sit to stands    Consulted and Agree with Plan of Care Patient             Patient will benefit from skilled therapeutic intervention in order to improve the following deficits and impairments:  Abnormal gait, Decreased balance, Decreased endurance, Decreased mobility, Difficulty walking, Hypomobility, Obesity, Pain, Postural dysfunction, Impaired flexibility, Decreased strength, Decreased activity tolerance, Decreased range of motion, Improper body mechanics  Visit Diagnosis: Pain in right hip     Problem List There are no problems to display for this patient.  Hilda Lias DPT Hilda Lias, PT 10/18/2021, 11:45 AM  [place] [facility] PHYSICAL AND SPORTS MEDICINE [address], [place], [address] Phone: [phone]   Fax:  [phone]  Name: April Richardson MRN: [id number] Date of Birth: December 23, 1950

## 2021-10-22 ENCOUNTER — Encounter: Payer: Medicare HMO | Admitting: Physical Therapy

## 2021-10-25 ENCOUNTER — Ambulatory Visit: Payer: Medicare HMO | Attending: Internal Medicine | Admitting: Physical Therapy

## 2021-10-25 ENCOUNTER — Encounter: Payer: Self-pay | Admitting: Physical Therapy

## 2021-10-25 ENCOUNTER — Other Ambulatory Visit: Payer: Self-pay

## 2021-10-25 DIAGNOSIS — M25551 Pain in right hip: Secondary | ICD-10-CM | POA: Diagnosis present

## 2021-10-25 NOTE — Therapy (Signed)
[facility] PH[facility]ICINE [address] [place], [address] Phone: [phone]   Fax:  [phone]  Physical Therapy Treatment Discharge Summary Reporting Period 10/02/21-10/25/21  Patient Details  Name: April Richardson MRN: [id number] Date of Birth: 1951/12/18 No data recorded  Encounter Date: 10/25/2021   PT End of Session - 10/25/21 1111     Visit Number 5    Number of Visits 17    Date for PT Re-Evaluation 11/27/21    Authorization - Visit Number 5    Progress Note Due on Visit 10    PT Start Time 1102    PT Stop Time 1140    PT Time Calculation (min) 38 min    Equipment Utilized During Treatment Gait belt    Activity Tolerance Patient tolerated treatment well    Behavior During Therapy WFL for tasks assessed/performed             Past Medical History:  Diagnosis Date   Arthritis    Depression    Diabetes mellitus without complication (HCC)    Hyperlipidemia    Hypertension     Past Surgical History:  Procedure Laterality Date   ABDOMINAL HYSTERECTOMY     BACK SURGERY     CESAREAN SECTION      There were no vitals filed for this visit.   Subjective Assessment - 10/25/21 1108     Subjective Pt denies any R hip pain this visit. She reports active participation with her HEP and feels shes has made 60-70% improvement since starting PT.    Pertinent History April Richardson is 70 y.o. female presenting to therapy with c/o R hip pain for the past 2 month. She reports increase in pain after visting her daughter and having to negotiate a lot of stairs. She reports having limitations with walking, standing, lifting, sitting, and balance. 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 She reports avoiding stairs, curbs, and laying on R hip due to aggravation. She reports her pain is 0/10 best and 6/10 at worst. She reports utilizing tylenol to alleviate  her pain. Her goal for PT is to reduce her R hip pain and negotiate a flight of steps in order to visit her daughter. She denies falling in the last 6 months, any unexplained weight fluctuation, saddle paresthesia, or unrelenting night pain at this time.    Limitations Walking;Standing    How long can you sit comfortably? n/a    How long can you stand comfortably?    How long can you walk comfortably? 10 min    Diagnostic tests xrays    Patient Stated Goals pt reports wanting to climb a flight of stairs            Therex   Recumbent Bike L1 x 4 min for gentle LE strengthening    Access Code: [email or web address]  Exercises Mini Squat with Counter Support - 1 x daily - 2-3 x weekly - 3 sets - 8-10 reps Side Stepping with Resistance at Ankles and Counter Support - 1 x daily - 2-3 x weekly - 3 sets - 8-10 reps Recumbent Bike - 1 x daily - 3-4 x weekly - 3 sets Standing March with Unilateral Counter Support - 1 x daily - 2-3 x weekly - 3 sets - 8-10 reps Supine Bridge - 1 x daily - 2-3 x weekly - 3 sets - 8-10 reps Standing Gluteal Stretch on Chair -  1 x daily - 7 x weekly - 3 sets - 10 reps Seated Piriformis Stretch - 1 x daily - 7 x weekly - 3 reps - 30 sec hold  5XSTS: 12 sec without UE support  Stair negotiation: reciprocal gait, single UE support, wit reported no pain  : 1.0 m/s      *PT reviewed exercises with patient and expressed importance of continuing exercises post PT. Patient achieved all set goals except single leg stance time (not tested)                     PT Education - 10/25/21 1111     Education Details therex form/technique    Person(s) Educated Patient    Methods Explanation;Demonstration    Comprehension Verbalized understanding;Returned demonstration              PT Short Term Goals - 10/02/21 1033       PT SHORT TERM GOAL #1   Title Pt will demonstrate independence with HEP to improve hip function for increased ability to  participate with ADLs    Baseline HEP given    Time 4    Period Weeks    Status New    Target Date 10/30/21               PT Long Term Goals - 10/25/21 1113       PT LONG TERM GOAL #1   Title Patient will increase FOTO score to 56 to demonstrate predicted increase in functional mobility to complete ADLs    Baseline 10/02/21: 33 10/25/21:99    Time 8    Period Weeks    Status Achieved      PT LONG TERM GOAL #2   Title Pt will perform 5XSTS within 30 seconds without UE support in order to demonstrate improved LE strength and decrease the likelihood of falling.    Baseline 10/02/21:37 sec with BUE 10/25/21: 12 sec without UE Support    Time 8    Status Achieved      PT LONG TERM GOAL #3   Title Pt will independently negotiate 4 6" stairs with reciprocal gait pattern, single UE support, pain no greater than 4/10 to demonstrate increased LE strength and decreased pain with functional activity.    Baseline 10/02/21 step to pattern with single UE support 10/25/21:  step to pattern with single UE support with no pain    Time 8    Period Weeks    Status Achieved      PT LONG TERM GOAL #4   Title Pt will increase gait speed in to >0.103m/s to demonstrate appropriate speed for limited community ambulation    Baseline 10/25/21: 40m/s    Time 8    Period Weeks    Status Achieved      PT LONG TERM GOAL #5   Title Pt will improve single leg stance to 10 sec or greater in order to demonstrate increased LE strength, increased balance, and decrease likelihood of falling    Baseline deferred    Period Weeks    Status Unable to assess                   Plan - 10/25/21 1149     Clinical Impression Statement Patient is aware of discharge and prognosis. Patient verbalized understanding of discharge recommendations, achieved progress, and importance of continuation of exercise post PT.Overall, patient has improved LE strength, decreased R hip pain, ability to negotiate  stairs,  and is indepenendent with HEP. An HEP was provided and verbally reviewed with patient able to verbalize understanding of exercises and prescription. Patient has achieved all set goals except single leg balance. End PT POC this episode.    Personal Factors and Comorbidities Age;Fitness;Sex;Comorbidity 2    Comorbidities DM, HTN, Obesity    Examination-Activity Limitations Transfers;Locomotion Level;Stairs;Stand;Carry    Examination-Participation Restrictions Occupation;Community Activity;Shop    Stability/Clinical Decision Making Stable/Uncomplicated    Clinical Decision Making Low    Rehab Potential Fair    PT Frequency 2x / week    PT Duration 8 weeks    PT Treatment/Interventions ADLs/Self Care Home Management;Cryotherapy;Electrical Stimulation;Moist Heat;Traction;Gait training;Stair training;Functional mobility training;Therapeutic activities;Neuromuscular re-education;Balance training;Therapeutic exercise;Patient/family education;Manual techniques;Passive range of motion;Joint Manipulations;Energy conservation;Dry needling;Taping    PT Next Visit Plan d/c'd    PT Home Exercise Plan Access Code: [email or web address]    Consulted and Agree with Plan of Care Patient             Patient will benefit from skilled therapeutic intervention in order to improve the following deficits and impairments:  Abnormal gait, Decreased balance, Decreased endurance, Decreased mobility, Difficulty walking, Hypomobility, Obesity, Pain, Postural dysfunction, Impaired flexibility, Decreased strength, Decreased activity tolerance, Decreased range of motion, Improper body mechanics  Visit Diagnosis: Pain in right hip     Problem List There are no problems to display for this patient.   Hilda Lias DPT Romilda Joy, SPT  Hilda Lias, PT 10/25/2021, 11:56 AM  [facility] PHYSICAL AND SPORTS MEDICINE [address] [place], [address] Phone: [phone]   Fax:   [phone]  Name: April Richardson MRN: [id number] Date of Birth: 09/14/1951

## 2021-10-29 ENCOUNTER — Encounter: Payer: Medicare HMO | Admitting: Physical Therapy

## 2021-11-01 ENCOUNTER — Encounter: Payer: Medicare HMO | Admitting: Physical Therapy

## 2021-11-01 IMAGING — CR DG CHEST 2V
1 series · 2 of 2 positions shown · non-contrast
Comparison: No pertinent prior exams available for comparison.

CLINICAL DATA: Chest pain last night.

EXAM:
CHEST - 2 VIEW

[Series 1: dg chest 2 view · 0.14mm/px · 2 of 2 slices shown]
[im 1/2]
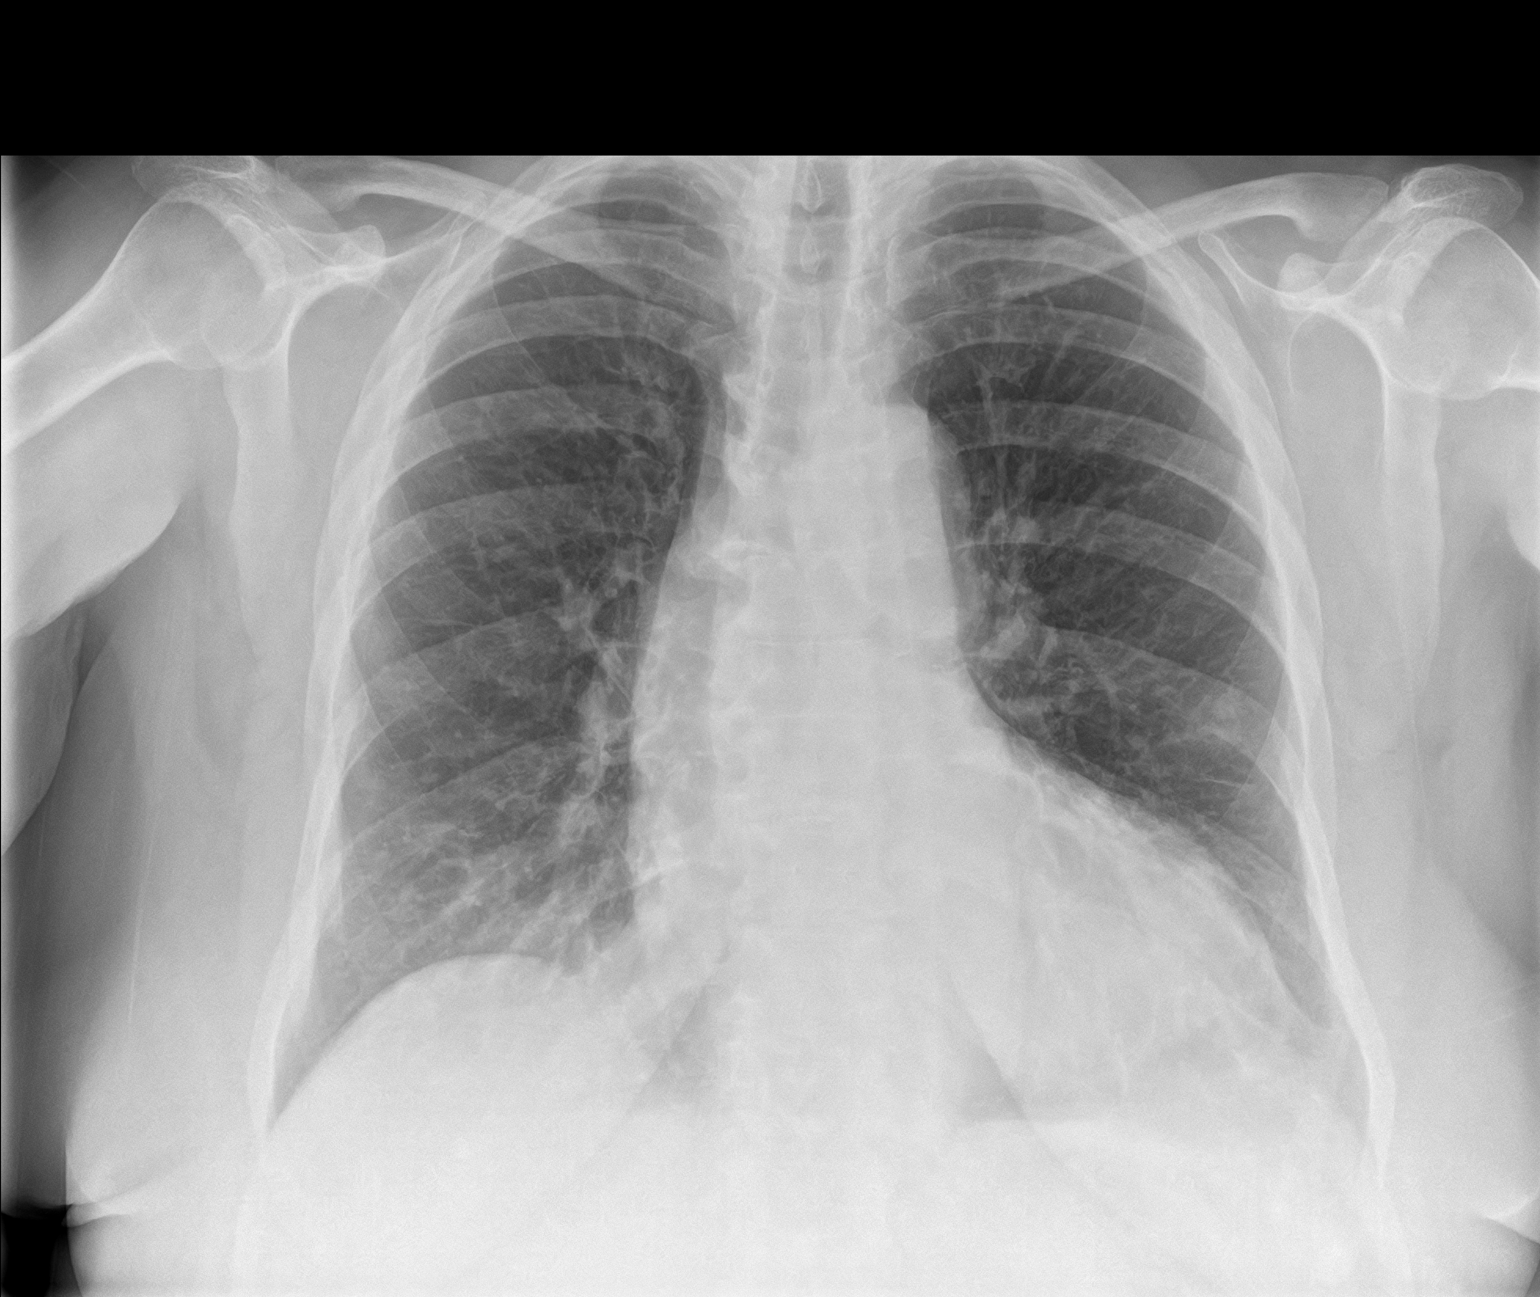
[im 2/2]
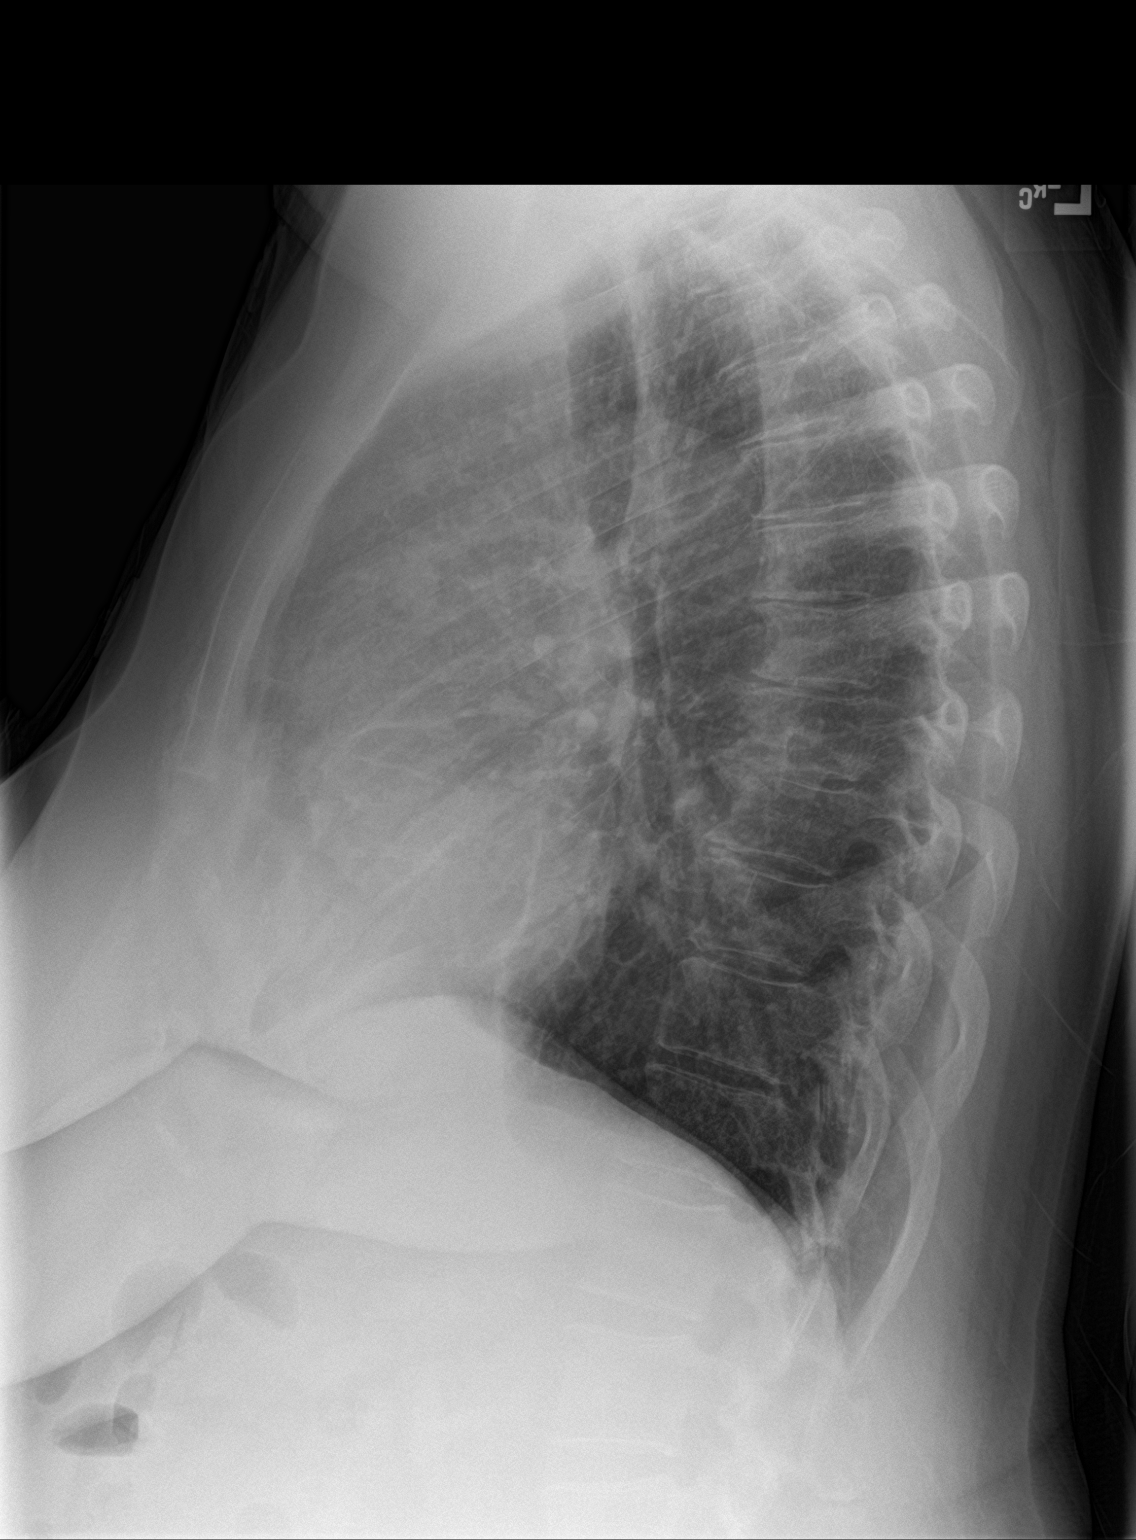

[2 of 2 positions shown; findings below may reference images not displayed]

FINDINGS: Mild cardiomegaly. Prominent epicardial fat. Mild ill-defined
opacity within the right lung base has an appearance most suggestive
of atelectasis. Suspected 6-7 mm nodule within the left mid lung. No
evidence of pleural effusion or pneumothorax. No acute bony
abnormality identified. Thoracic spondylosis.
IMPRESSION: Cardiomegaly.

Suspected 6-7 mm nodule within the left mid lung. Nonemergent chest
CT is recommended for further evaluation.

Mild ill-defined opacity within the right lung base, which has an
appearance most suggestive of atelectasis.

## 2021-11-05 ENCOUNTER — Encounter: Payer: Medicare HMO | Admitting: Physical Therapy

## 2021-11-08 ENCOUNTER — Encounter: Payer: Medicare HMO | Admitting: Physical Therapy

## 2021-11-12 ENCOUNTER — Encounter: Payer: Medicare HMO | Admitting: Physical Therapy

## 2021-11-19 ENCOUNTER — Encounter: Payer: Medicare HMO | Admitting: Physical Therapy

## 2021-11-29 ENCOUNTER — Ambulatory Visit: Payer: Medicare HMO | Attending: Internal Medicine

## 2021-11-29 ENCOUNTER — Ambulatory Visit: Payer: Medicare HMO

## 2021-12-27 ENCOUNTER — Other Ambulatory Visit: Payer: Self-pay

## 2021-12-27 ENCOUNTER — Ambulatory Visit
Admission: RE | Admit: 2021-12-27 | Discharge: 2021-12-27 | Disposition: A | Discharge status: Home / Self Care | Payer: Medicare HMO | Source: Ambulatory Visit | Attending: Internal Medicine | Admitting: Internal Medicine

## 2021-12-27 DIAGNOSIS — R911 Solitary pulmonary nodule: Secondary | ICD-10-CM | POA: Insufficient documentation

## 2022-04-28 IMAGING — CT NM PET TUM IMG INITIAL (PI) SKULL BASE T - THIGH
1 of 9 series · 1 of 25 positions shown · non-contrast
Comparison: 1 abdominal CT 12/03/2020.  Chest CT 01/16/2021.

CLINICAL DATA: Initial treatment strategy for multiple pulmonary
nodules on CT. History of diabetes.

EXAM:
NUCLEAR MEDICINE PET SKULL BASE TO THIGH
TECHNIQUE: 11.41 mCi F-18 FDG was injected intravenously. Full-ring PET imaging
was performed from the skull base to thigh after the radiotracer. CT
data was obtained and used for attenuation correction and anatomic
localization.
Fasting blood glucose: 171 mg/dl

[Series 3: ct wb 5.0 b30f · axial · 5.0mm · 0.98mm/px · 1 of 290 slices shown]
[im 290/290  brain]
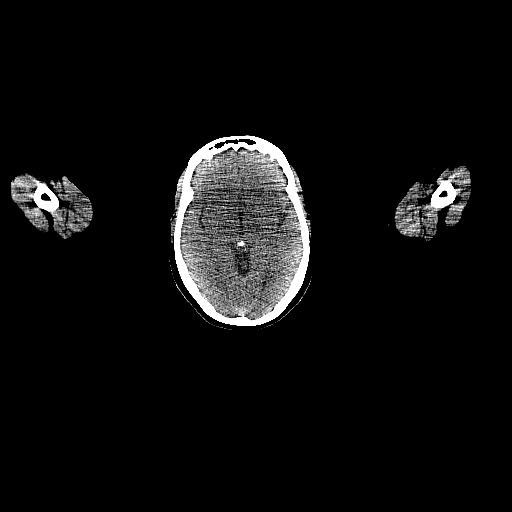

[1 of 25 positions shown; findings below may reference images not displayed]

FINDINGS: Mediastinal blood pool activity: SUV max

NECK:

No hypermetabolic cervical lymph nodes are identified.There are no
lesions of the pharyngeal mucosal space.

Incidental CT findings: none

CHEST:

There are no hypermetabolic mediastinal, hilar or axillary lymph
nodes. Multiple pulmonary nodules are again noted, grossly unchanged
in size from the prior chest CT and without hypermetabolic activity.
The largest nodule is located anteriorly near the minor fissure,
measuring 10 mm on image 88/3. No new or enlarging pulmonary nodules
are identified.

Incidental CT findings: Mild atherosclerosis of the aorta, great
vessels and coronary arteries. Mild cardiac enlargement.

ABDOMEN/PELVIS:

There is no hypermetabolic activity within the liver, adrenal
glands, spleen or pancreas. There is no hypermetabolic nodal
activity.

Incidental CT findings: Physiologic bowel activity. Interval
cholecystectomy. Pneumobilia likely relates to interval
sphincterotomy. The liver demonstrates diffusely decreased density
consistent with steatosis. Simple and hyperdense right renal cysts
demonstrate no hypermetabolic activity.

SKELETON:

There is no hypermetabolic activity to suggest osseous metastatic
disease.

Incidental CT findings: Lumbar spondylosis with degenerative
anterolisthesis and resulting foraminal narrowing at L4-5.
IMPRESSION: 1. There is no hypermetabolic activity associated with the multiple
pulmonary nodules seen previously, and these are grossly unchanged
in size from prior CT of more than 4 months ago. This supports a
benign etiology.
2. No primary malignancy identified.  No hypermetabolic adenopathy.
3. To better address stability of the pulmonary nodules, follow-up
noncontrast chest [REDACTED] months from now recommended. This
recommendation follows the consensus statement: Guidelines for
Management of Small Pulmonary Nodules Detected on CT Images: From
the [HOSPITAL] 7023; Radiology 7023; [DATE].
4. Incidental findings including simple and mildly complex renal
cysts, hepatic steatosis, coronary and Aortic Atherosclerosis
(T9S25-PBS.S).
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
## 2023-02-16 ENCOUNTER — Encounter: Payer: Self-pay | Admitting: Psychiatry

## 2023-02-16 ENCOUNTER — Ambulatory Visit: Payer: Medicare HMO | Admitting: Psychiatry

## 2023-02-16 VITALS — BP 136/79 | HR 69 | Temp 97.3°F | Ht 62.0 in | Wt 195.4 lb

## 2023-02-16 DIAGNOSIS — F330 Major depressive disorder, recurrent, mild: Secondary | ICD-10-CM

## 2023-02-16 MED ORDER — VENLAFAXINE HCL ER 150 MG PO CP24: 150.0000 mg | ORAL_CAPSULE | Freq: Every day | ORAL | 0 refills | Status: DC
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
## 2023-04-13 ENCOUNTER — Other Ambulatory Visit: Payer: Self-pay | Admitting: Internal Medicine

## 2023-04-13 DIAGNOSIS — Z0000 Encounter for general adult medical examination without abnormal findings: Secondary | ICD-10-CM

## 2023-04-13 DIAGNOSIS — R918 Other nonspecific abnormal finding of lung field: Secondary | ICD-10-CM

## 2023-04-14 ENCOUNTER — Ambulatory Visit: Payer: Medicare HMO | Admitting: Psychiatry

## 2023-04-14 ENCOUNTER — Encounter: Payer: Self-pay | Admitting: Psychiatry

## 2023-04-14 VITALS — BP 136/74 | HR 69 | Temp 97.4°F | Ht 62.0 in | Wt 192.0 lb

## 2023-04-14 DIAGNOSIS — R4189 Other symptoms and signs involving cognitive functions and awareness: Secondary | ICD-10-CM | POA: Diagnosis not present

## 2023-04-14 DIAGNOSIS — F331 Major depressive disorder, recurrent, moderate: Secondary | ICD-10-CM

## 2023-04-14 MED ORDER — VENLAFAXINE HCL ER 37.5 MG PO CP24: 37.5000 mg | ORAL_CAPSULE | Freq: Every day | ORAL | 0 refills | Status: DC

## 2023-04-14 MED ORDER — VENLAFAXINE HCL ER 150 MG PO CP24: 150.0000 mg | ORAL_CAPSULE | Freq: Every day | ORAL | 1 refills | Status: DC

## 2023-04-20 ENCOUNTER — Telehealth: Payer: Self-pay

## 2023-04-20 NOTE — Telephone Encounter (Signed)
pt daughter states that her mom needs a 30 day supply of the venlafaxine 150mg  and the 37.5mg  [facility] [address] .

## 2023-04-20 NOTE — Telephone Encounter (Signed)
Could you please verify with the pharmacy? I recall confirming that she should have enough medication/order has been sent to last until the next visit.

## 2023-04-21 ENCOUNTER — Other Ambulatory Visit: Payer: Self-pay | Admitting: Psychiatry

## 2023-04-21 MED ORDER — VENLAFAXINE HCL ER 150 MG PO CP24: 150.0000 mg | ORAL_CAPSULE | Freq: Every day | ORAL | 0 refills | Status: DC

## 2023-04-21 NOTE — Telephone Encounter (Signed)
pt daugher states that since you already sent one to the [facility] on [address] just go ahead and send the venlafaxine 150mg  #30 to that one also .

## 2023-04-27 ENCOUNTER — Ambulatory Visit
Admission: RE | Admit: 2023-04-27 | Discharge: 2023-04-27 | Disposition: A | Discharge status: Home / Self Care | Payer: Medicare HMO | Source: Ambulatory Visit | Attending: Internal Medicine | Admitting: Internal Medicine

## 2023-04-27 DIAGNOSIS — R918 Other nonspecific abnormal finding of lung field: Secondary | ICD-10-CM

## 2023-04-27 DIAGNOSIS — Z0000 Encounter for general adult medical examination without abnormal findings: Secondary | ICD-10-CM

## 2023-05-22 ENCOUNTER — Ambulatory Visit (INDEPENDENT_AMBULATORY_CARE_PROVIDER_SITE_OTHER): Payer: Medicare HMO | Admitting: Clinical

## 2023-05-22 DIAGNOSIS — F332 Major depressive disorder, recurrent severe without psychotic features: Secondary | ICD-10-CM | POA: Diagnosis not present

## 2023-05-22 NOTE — Progress Notes (Signed)
                April Moman, LCSW 
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
## 2023-06-09 ENCOUNTER — Ambulatory Visit (INDEPENDENT_AMBULATORY_CARE_PROVIDER_SITE_OTHER): Payer: Medicare HMO | Admitting: Clinical

## 2023-06-09 DIAGNOSIS — F332 Major depressive disorder, recurrent severe without psychotic features: Secondary | ICD-10-CM | POA: Diagnosis not present

## 2023-06-09 NOTE — Progress Notes (Signed)
  [facility] Counselor/Therapist Progress Note  Patient ID: April Richardson, MRN: [id number]    Date: 06/09/23  Time Spent: 10:41  am - 11:24 am : 53 Minutes  Treatment Type: Individual Therapy.  Reported Symptoms: Patient reported recent improvement in energy and reported lack of motivation.   Mental Status Exam: Appearance:  Neat and Well Groomed     Behavior: Appropriate  Motor: Normal  Speech/Language:  Clear and Coherent  Affect: Appropriate  Mood: normal  Thought process: normal  Thought content:   WNL  Sensory/Perceptual disturbances:   WNL  Orientation: oriented to person, place, and situation  Attention: Good  Concentration: Good  Memory: WNL  Fund of knowledge:  Good  Insight:   Fair  Judgment:  Good  Impulse Control: Good   Risk Assessment: Danger to Self:  No Patient denied current suicidal ideation  Self-injurious Behavior: No Danger to Others: No Patient denied current homicidal ideation Duty to Warn:no Physical Aggression / Violence:No  Access to Firearms a concern: No  Gang Involvement:No   Subjective:  Patient stated, "about the same" since last session. Patient stated, "I've noticed that I have a little more energy, not a lot". Patient reported she started taking 37.5 mg of venlafaxine in the mornings and 150 mg at night at the end of May. Patient stated, "Its like I want to go and do things but I don't have the gumption or want to actually do it". Patient stated, "I'm ok today". 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 Patient stated, "I wish I didn't have to take it all" in response to psychotropic medications. Patient reported she has been taking venlafaxine for years. Patient reported she feels psychotropic medications have caused patient to retain weight. Patient stated, "I would like to do that" in response to a consultation with a psychiatrist. Patient reported she is open to participation in therapy. Patient stated,  "I don't have any expectations because I've never had therapy so I don't know what to expect at all" in response to potential goals for therapy.   Interventions: Motivational Interviewing. Clinician conducted session via caregility video from clinician's office at [facility]. Patient provided verbal consent to proceed with telehealth session and is aware of limitations of telephone or video visits. Patient participated in session from patient's home. Reviewed symptoms since last session. Clinician reviewed diagnosis and treatment recommendations. Provided psycho education related to diagnosis and treatment. Clinician utilized motivational interviewing to explore potential goals for therapy. Clinician requested patient consider goals for therapy.   Collaboration of Care: Other Discussed consent for a referral to a psychiatrist    Diagnosis:  Severe episode of recurrent major depressive disorder, without psychotic features (HCC)   Plan: Goals to be developed during follow up appointment on 07/07/23.              Doree Barthel, LCSW

## 2023-06-16 ENCOUNTER — Telehealth: Payer: Self-pay | Admitting: Psychiatry

## 2023-06-16 NOTE — Telephone Encounter (Signed)
It is called the KELS assessment by occupational therapy. According to the chart, OT left a message on 6/18. Advise them to check the voicemail for this referral/assessment.

## 2023-06-16 NOTE — Telephone Encounter (Signed)
Patient's daughter asked about a testing that was recommended. She can not recall the name of the test, but said it was discussed at last appointment. Patient would like a call back to discuss-Please advise

## 2023-07-06 ENCOUNTER — Ambulatory Visit: Payer: Medicare HMO | Admitting: Psychiatry

## 2023-07-07 ENCOUNTER — Ambulatory Visit (INDEPENDENT_AMBULATORY_CARE_PROVIDER_SITE_OTHER): Payer: Medicare HMO | Admitting: Clinical

## 2023-07-07 DIAGNOSIS — F332 Major depressive disorder, recurrent severe without psychotic features: Secondary | ICD-10-CM

## 2023-07-07 NOTE — Progress Notes (Signed)
    [facility] Counselor/Therapist Progress Note  Patient ID: April Richardson, MRN: [id number]    Date: 07/07/23  Time Spent: 8:36  am - 9:22 am : 46 Minutes  Treatment Type: Individual Therapy.  Reported Symptoms: Patient reported recent improvement in mood  Mental Status Exam: Appearance:  Neat and Well Groomed     Behavior: Appropriate  Motor: Normal  Speech/Language:  Clear and Coherent  Affect: Appropriate  Mood: normal  Thought process: normal  Thought content:   WNL  Sensory/Perceptual disturbances:   WNL  Orientation: oriented to person, place, and situation  Attention: Good  Concentration: Good  Memory: WNL  Fund of knowledge:  Good  Insight:   Fair  Judgment:  Good  Impulse Control: Good   Risk Assessment: Danger to Self:  No Patient denied current suicidal ideation  Self-injurious Behavior: No Danger to Others: No Patient denied current homicidal ideation Duty to Warn:no Physical Aggression / Violence:No  Access to Firearms a concern: No  Gang Involvement:No   Subjective:  Patient stated, "Ok, I think" since last session.  Patient stated, "I think my mood has been pretty up" in response to patient's mood since last session. Patient reported she has been spending time with her grandson this summer and reported her mood has benefited from time spent with her grandson.  Patient reported she has an upcoming trip to visit to her daughter in [place]. Patient stated, "it really makes me miss my other grandchildren" in response to time spent with patient's grandson and patient stated, "I'm trying to focus on the positive". Patient stated, "today I would say it's quite good" in response to patient's current mood. Patient stated, "I want to maybe learn to be more understanding of how the things I say or do affect other people". Patient stated, "I want to be able to work through whatever is causing me to hurt other people". Patient reported she does not  know why her son and daughter in law are not communicating with patient or patient's daughters. Patient reported she feels she did something to cause her son and daughter in law to stop speaking to patient and not allowing patient's grandchildren to speak with patient.   Interventions: Motivational Interviewing.Clinician conducted session via caregility video from clinician's office at [facility]. Patient provided verbal consent to proceed with telehealth session and is aware of limitations of telephone or video visits. Patient participated in session from patient's home. Reviewed events since last session. Assessed patient's mood since last session and patient's current mood. Clinician utilized motivational interviewing to explore potential goals for therapy. Provided psycho education related to goals, CBT, and cognitive distortions, such as, assumptions.   Collaboration of Care: Other not required at this time  Diagnosis:  Severe episode of recurrent major depressive disorder, without psychotic features (HCC)   Plan: Goals to be determined during follow up appointment on 08/05/23. Patient experienced difficulty identifying goals for therapy.                     Doree Barthel, LCSW

## 2023-07-20 ENCOUNTER — Ambulatory Visit: Payer: Medicare HMO | Admitting: Clinical

## 2023-08-05 ENCOUNTER — Ambulatory Visit (INDEPENDENT_AMBULATORY_CARE_PROVIDER_SITE_OTHER): Payer: Medicare HMO | Admitting: Clinical

## 2023-08-05 DIAGNOSIS — F332 Major depressive disorder, recurrent severe without psychotic features: Secondary | ICD-10-CM

## 2023-08-05 NOTE — Progress Notes (Signed)
[facility] Counselor/Therapist Progress Note  Patient ID: April Richardson, MRN: [id number]    Date: 08/05/23  Time Spent: 1:33  pm - 2:13 pm : 40 Minutes  Treatment Type: Individual Therapy.  Reported Symptoms: none reported during today's session  Mental Status Exam: Appearance:  Neat and Well Groomed     Behavior: Appropriate  Motor: Normal  Speech/Language:  Clear and Coherent  Affect: Appropriate  Mood: normal  Thought process: normal  Thought content:   WNL  Sensory/Perceptual disturbances:   WNL  Orientation: oriented to person, place, and situation  Attention: Good  Concentration: Good  Memory: WNL  Fund of knowledge:  Good  Insight:   Fair  Judgment:  Good  Impulse Control: Good   Risk Assessment: Danger to Self:  No Patient denied current suicidal ideation  Self-injurious Behavior: No Danger to Others: No Patient denied current homicidal ideation Duty to Warn:no Physical Aggression / Violence:No  Access to Firearms a concern: No  Gang Involvement:No   Subjective:  Patient reported feeling tired. Patient reported she recently traveled to [place] and was able to connect with some friends while she was in [place]. Patient reported she was able to see her daughter whom she has not seen in 3 years. Patient reported she worried about her husband while she was in [place]. Patient reported her husband has a diagnosis of dementia. Patient stated, "It's been a good couple of weeks". Patient stated, "mostly good" in response to patient's mood since last session. Patient reported while on vacation patient's daughter became upset with patient as a result of patient's communication with her grandson. Patient reported she felt time with her grandson was being taking away from patient.  Patient reported she verbally retaliates when upset by a situation. Patient reported she has not had an opportunity to think about goals since last session.   Interventions:  Motivational Interviewing. Clinician conducted session via caregility video from clinician's office at [facility]. Patient provided verbal consent to proceed with telehealth session and is aware of limitations of telephone or video visits. Patient participated in session from patient's home. Discussed patient's recent trip to [place]. Assessed patient's mood since last session.  Provided psycho education related to cognitive distortions/automatic negative thoughts. Clinician utilized motivational interviewing to explore potential goals for therapy. Clinician utilized a task centered approach in collaboration with patient to begin to develop goals for therapy. Goals to be finalized during patient's follow up appointment on 08/31/23.   Diagnosis:  Severe episode of recurrent major depressive disorder, without psychotic features (HCC)   Plan: Patient is to utilize [employer] [facility], thought re-framing, communication skills, and coping strategies to decrease symptoms associated with their diagnosis. Frequency: bi-weekly  Modality: individual     Long-term goal:   To be determined during follow up appointment on 08/31/23.  Short-term goal:  Develop and verbally express an understanding of the impact patient's behaviors and verbal communication have on others and patient's relationships with others Target Date: 02/05/24  Progress: 0   Develop and implement healthy communication strategies for patient to utilize when expressing her thoughts and feelings to others in a controlled and assertive way  Target Date: 02/05/24  Progress: 0   Identify, challenge, and replace negative thought patterns that contribute to feelings of depression and feelings of hurt with positive thoughts, positive beliefs and positive self talk per patient's report Target Date: 02/05/24  Progress: 0  Doree Barthel, LCSW
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
## 2023-08-11 ENCOUNTER — Encounter: Payer: Self-pay | Admitting: Psychiatry

## 2023-08-11 ENCOUNTER — Ambulatory Visit: Payer: Medicare HMO | Admitting: Psychiatry

## 2023-08-11 VITALS — BP 152/68 | HR 82 | Temp 97.5°F | Ht 62.0 in | Wt 189.2 lb

## 2023-08-11 DIAGNOSIS — F331 Major depressive disorder, recurrent, moderate: Secondary | ICD-10-CM

## 2023-08-11 DIAGNOSIS — R4189 Other symptoms and signs involving cognitive functions and awareness: Secondary | ICD-10-CM | POA: Diagnosis not present

## 2023-08-11 NOTE — Patient Instructions (Signed)
Change- take venlafaxine 187.5 mg daily  Next appointment: 10/8 at 10 30

## 2023-08-18 ENCOUNTER — Ambulatory Visit: Payer: Medicare HMO

## 2023-08-18 DIAGNOSIS — R413 Other amnesia: Secondary | ICD-10-CM | POA: Diagnosis not present

## 2023-08-18 DIAGNOSIS — R4701 Aphasia: Secondary | ICD-10-CM | POA: Diagnosis not present

## 2023-08-18 DIAGNOSIS — R4189 Other symptoms and signs involving cognitive functions and awareness: Secondary | ICD-10-CM | POA: Diagnosis present

## 2023-08-18 NOTE — Therapy (Signed)
OUTPATIENT OCCUPATIONAL THERAPY NEURO EVALUATION (KELS)  Patient Name: April Richardson MRN: [id number] DOB:1951-12-12, 72 y.o., female Today's Date: 08/18/2023  PCP: Barbette Reichmann REFERRING PROVIDER: Dr. Neysa Hotter   END OF SESSION:  OT End of Session - 08/18/23 1532     Visit Number 1    Number of Visits 1    OT Start Time 1315    OT Stop Time 1418    OT Time Calculation (min) 63 min    Equipment Utilized During Treatment KELS    Activity Tolerance Patient tolerated treatment well    Behavior During Therapy WFL for tasks assessed/performed            Past Medical History:  Diagnosis Date   Arthritis    Depression    Diabetes mellitus without complication (HCC)    Hyperlipidemia    Hypertension    Past Surgical History:  Procedure Laterality Date   ABDOMINAL HYSTERECTOMY     BACK SURGERY     CESAREAN SECTION     There are no problems to display for this patient.  ONSET DATE: 04/14/23 (date of referral)  REFERRING DIAG: Cognitive deficits  THERAPY DIAG:  Cognitive deficits  Rationale for Evaluation and Treatment: Rehabilitation  SUBJECTIVE:   SUBJECTIVE STATEMENT: Pt reports she has some memory impairment and some word finding problems.   Pt accompanied by: self  PERTINENT HISTORY: Hx of depression and cognitive deficits  PRECAUTIONS: None  PAIN:  Are you having pain? No  LIVING ENVIRONMENT: Lives with: spouse who has dementia, daughter, and 29 y/o grandson Lives in: house  PLOF: Independent with basic ADLs; daughter manages bill paying and meds at baseline  PATIENT GOALS: Agreeable to participate in KELs assessment.    OBJECTIVE:   HAND DOMINANCE: Right  ADLs: indep with basic ADLs, daughter assists with bills and medication management; indep with cooking.  See KELS assessment for additional details.  MOBILITY STATUS: Independent without AD  POSTURE COMMENTS:  No Significant postural limitations  ACTIVITY  TOLERANCE: Activity tolerance: WNL for tasks assessed  FUNCTIONAL OUTCOME MEASURES: FOTO: NT/NA  UPPER EXTREMITY ROM: NT  UPPER EXTREMITY MMT:  NT  HAND FUNCTION: WNL  COORDINATION: WNL  SENSATION: NT/NA  EDEMA: No visible edema  MUSCLE TONE: BUEs WNL  COGNITION: Overall cognitive status: History of cognitive impairments - at baseline; memory and word finding deficits, per pt  OBSERVATIONS: Pt pleasant, cooperative.  Pt unaware of reason for OT evaluation but agreeable to participate without issue after referral and assessment was explained to pt.  TODAY'S TREATMENT:                                                                                                                              DATE: 08/18/23: Evaluation completed: [facility] Evaluation of Living Skills (KELS); KELS score form will be uploaded to pt chart and faxed to MD.  OT reviewed results of KELS with pt.  Reviewed benefits of continued engagement in leisure  activities, and importance of completion of at least some leisure activities with other people.  Reviewed external memory aids, ie calendars/written notes/organizational strategies, with which pt confirmed daughter helps to implement.  Pt acknowledged understanding of above.  PATIENT EDUCATION: Education details: Role of OT, goals, poc (evaluation only) Person educated: Patient Education method: Explanation Education comprehension: verbalized understanding  HOME EXERCISE PROGRAM: N/A  GOALS: Goals reviewed with patient? Yes  SHORT TERM GOALS: Target date: 08/18/23   Pt will complete KELs assessment to determine need for follow up OT. Baseline: Eval: KELs completed/no additional OT follow up needed. Goal status: achieved  ASSESSMENT:  CLINICAL IMPRESSION: Patient is a 72 y.o. female who was seen today for occupational therapy evaluation for completion of KELS; referring dx of cognitive deficits and hx of depression.  KELS score indicates pt appears to  be safe and meet basic needs living alone with regular phone calls/emails to check in, however, daughter has lived with pt for 13 years and provides the necessary supervision to assist with bills, medication, and appt reminders, and pt reports there are no plans to change this living arrangement.  Therefore, pt appears to have the appropriate level of supervision to meet her daily needs.  No additional skilled OT indicated at this time.    PERFORMANCE DEFICITS: in functional skills including IADLs, cognitive skills including energy/drive, memory, and word finding .    IMPAIRMENTS: are limiting patient from: N/A.   CO-MORBIDITIES: has co-morbidities such as depression and cognitive deficits  that affects occupational performance.   MODIFICATION OR ASSISTANCE TO COMPLETE EVALUATION: No modification of tasks or assist necessary to complete an evaluation.  OT OCCUPATIONAL PROFILE AND HISTORY: Problem focused assessment: Including review of records relating to presenting problem.  CLINICAL DECISION MAKING: LOW - limited treatment options, no task modification necessary  REHAB POTENTIAL: N/A; no additional skilled OT indicated at this time  EVALUATION COMPLEXITY: Moderate    PLAN:  OT FREQUENCY: one time visit  OT DURATION: N/A  PLANNED INTERVENTIONS: self care/ADL training, patient/family education, and cognitive remediation/compensation  RECOMMENDED OTHER SERVICES: May benefit from SLP evaluation in the future if pt experiences worsening cognitive decline or word finding problems.  Pt not interested in pursuing this currently.  CONSULTED AND AGREED WITH PLAN OF CARE: Patient  PLAN FOR NEXT SESSION: N/A; eval only  Danelle Earthly, MS, OTR/L  Otis Dials, OT 08/18/2023, 3:34 PM

## 2023-08-20 ENCOUNTER — Other Ambulatory Visit: Payer: Self-pay | Admitting: Psychiatry

## 2023-08-31 ENCOUNTER — Ambulatory Visit (INDEPENDENT_AMBULATORY_CARE_PROVIDER_SITE_OTHER): Payer: Medicare HMO | Admitting: Clinical

## 2023-08-31 DIAGNOSIS — F332 Major depressive disorder, recurrent severe without psychotic features: Secondary | ICD-10-CM | POA: Diagnosis not present

## 2023-08-31 NOTE — Progress Notes (Signed)
[facility] Counselor/Therapist Progress Note  Patient ID: Migna Houdek, MRN: [id number],    Date: 08/31/2023  Time Spent: 8:40am - 9:16am : 36 minutes   Treatment Type: Individual Therapy  Reported Symptoms: Patient reported a recent increase in energy level.   Mental Status Exam: Appearance:  Neat and Well Groomed     Behavior: Appropriate  Motor: Normal  Speech/Language:  Clear and Coherent  Affect: Appropriate  Mood: normal  Thought process: normal  Thought content:   WNL  Sensory/Perceptual disturbances:   WNL  Orientation: oriented to person, place, and situation  Attention: Good  Concentration: Good  Memory: WNL  Fund of knowledge:  Good  Insight:   Good  Judgment:  Good  Impulse Control: Good   Risk Assessment: Danger to Self:  No Patient denied current suicidal ideation  Self-injurious Behavior: No Danger to Others: No Patient denied current homicidal ideation Duty to Warn:no Physical Aggression / Violence:No  Access to Firearms a concern: No  Gang Involvement:No   Subjective: Patient was delayed in logging into caregility for today's session. Patient reported she experienced difficulty logging into caregility for today's session. Patient stated, "I think things are going pretty well". Patient stated, "I've been more engaged in my life".  Patient reported she is completing more household tasks and stated, "in general trying to be present with my family". Patient reported a recent increase in energy. Patient reported her favorite seasons are fall and spring. Patient stated, "my mood has been pretty good". Patient reported during a recent visit with her psychiatrist, patient's psychiatrist changed patient's medication regime. Patient reported she is trying to be more mindful of statements she makes to others.   Interventions: Motivational Interviewing. Clinician conducted session via caregility video from clinician's home office. Patient  provided verbal consent to proceed with telehealth session and is aware of limitations of telephone or video visits. Patient participated in session from patient's home. Reviewed recent events and changes in symptoms. Explored and identified contributing factors to recent increase in energy. Provided psycho education related to grief, depression, automatic negative thoughts, and cognitive distortions. Clinician utilized motivational interviewing to explore potential goals for therapy. Clinician utilized a task centered approach in collaboration with patient to develop additional goals for therapy. Patient participated in development of goals and agreed to goals for therapy. Clinician requested for homework patient complete a thought record.    Diagnosis:  Severe episode of recurrent major depressive disorder, without psychotic features (HCC)     Plan: Patient is to utilize [employer] [facility], thought re-framing, communication skills, and coping strategies to decrease symptoms associated with their diagnosis. Frequency: bi-weekly  Modality: individual      Long-term goal:   Develop and verbally express an understanding of the impact patient's behaviors and verbal communication have on others and patient's relationships with others Target Date: 08/04/24  Progress: 0     Short-term goal:  Develop, implement, and maintain healthy communication strategies for patient to utilize when expressing her thoughts and feelings to others in a controlled and assertive way  Target Date: 02/05/24  Progress: 0    Identify, challenge, and replace negative thought patterns that contribute to feelings of depression and feelings of hurt with positive thoughts, positive beliefs and positive self talk per patient's report Target Date: 02/05/24  Progress: 0    Reduce overall level, frequency, and intensity of the feelings of depression as evidenced by decreased depressed mood, lack of energy, fatigue, loss of  interest, decreased concentration, feelings of  guilt, loses her train of thought during conversations, and difficulty recalling words in conversations/names from 2 days/week to 0 to 1 days/week per patient report for at least 3 consecutive months. Target Date: 02/05/24  Progress: 0                       Doree Barthel, LCSW

## 2023-08-31 NOTE — Progress Notes (Signed)
                Karen Sharpe, LCSW 

## 2023-09-14 ENCOUNTER — Ambulatory Visit (INDEPENDENT_AMBULATORY_CARE_PROVIDER_SITE_OTHER): Payer: Medicare HMO | Admitting: Clinical

## 2023-09-14 DIAGNOSIS — F332 Major depressive disorder, recurrent severe without psychotic features: Secondary | ICD-10-CM | POA: Diagnosis not present

## 2023-09-14 NOTE — Progress Notes (Addendum)
[facility] Counselor/Therapist Progress Note  Patient ID: April Richardson, MRN: [id number],    Date: 09/14/2023  Time Spent: 8:32am - 9:18am : 46 minutes   Treatment Type: Individual Therapy  Reported Symptoms: Patient was tearful when discussing current stressors  Mental Status Exam: Appearance:  Neat and Well Groomed     Behavior: Appropriate  Motor: Normal  Speech/Language:  Clear and Coherent  Affect: Tearful when discussing current stressors  Mood: normal   Thought process: normal  Thought content:   WNL  Sensory/Perceptual disturbances:   WNL  Orientation: oriented to person, place, and situation  Attention: Good  Concentration: Good  Memory: WNL  Fund of knowledge:  Good  Insight:   Good  Judgment:  Good  Impulse Control: Good   Risk Assessment: Danger to Self:  No Patient denied current suicidal ideation  Self-injurious Behavior: No Danger to Others: No Patient denied current homicidal ideation Duty to Warn:no Physical Aggression / Violence:No  Access to Firearms a concern: No  Gang Involvement:No   Subjective: Patient reported she has been experiencing frequent headaches and reported she followed up with her physician to discuss the headaches. Patient reported she has been in bed the majority of the weekend due to headaches. Patient reported she has been experiencing headaches for several months. Patient reported the current political climate is a current stressor and patient stated, "it's just scary". Patient reported reading family and friends political responses on social media are a current stressor. Patient stated, "its beginning to affect my physical health" in response to stressor. Patient reported she has always been interested in history and reported she majored in Investment banker, corporate and minored in history. Patient reported she wants to remain informed of current political issues but feels she may need to limit the information. Patient  reported she is trying to be more mindful of her daughter's parenting decisions since her grandson and daughter live in the home with patient. Patient reported she is trying to support her daughter's parenting decisions and reported last week there was a conflict. Patient stated, "so far so good" in response to patient's current mood.   Interventions: Cognitive Behavioral Therapy. Clinician conducted session via caregility video from clinician's home office. Patient provided verbal consent to proceed with telehealth session and is aware of limitations of telephone or video visits. Patient participated in session from patient's home. Reviewed events since last session. Discussed current stressors, and assisted patient in discussing and identifying thoughts/feelings triggered by current stressors. Explored and identified strategies to decrease the impact of current stressor on patient's mental health, such as, limiting time spent on social media and news sources.  Assessed patient's current mood. Clinician requested for homework patient continue thought record.   Collaboration of Care: Other not required at this time  Diagnosis:  Severe episode of recurrent major depressive disorder, without psychotic features (HCC)     Plan: Patient is to utilize [employer] Therapy, thought re-framing, communication skills, and coping strategies to decrease symptoms associated with their diagnosis. Frequency: bi-weekly  Modality: individual      Long-term goal:   Develop and verbally express an understanding of the impact patient's behaviors and verbal communication have on others and patient's relationships with others Target Date: 08/04/24  Progress: progressing      Short-term goal:  Develop, implement, and maintain healthy communication strategies for patient to utilize when expressing her thoughts and feelings to others in a controlled and assertive way  Target Date: 02/05/24  Progress: progressing  Identify, challenge, and replace negative thought patterns that contribute to feelings of depression and feelings of hurt with positive thoughts, positive beliefs and positive self talk per patient's report Target Date: 02/05/24  Progress: progressing    Reduce overall level, frequency, and intensity of the feelings of depression as evidenced by decreased depressed mood, lack of energy, fatigue, loss of interest, decreased concentration, feelings of guilt, loses her train of thought during conversations, and difficulty recalling words in conversations/names from 2 days/week to 0 to 1 days/week per patient report for at least 3 consecutive months. Target Date: 02/05/24  Progress: progressing                   Doree Barthel, LCSW

## 2023-09-14 NOTE — Progress Notes (Signed)
                Dezi Schaner, LCSW 
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
## 2023-09-28 ENCOUNTER — Ambulatory Visit (INDEPENDENT_AMBULATORY_CARE_PROVIDER_SITE_OTHER): Payer: Medicare HMO | Admitting: Clinical

## 2023-09-28 DIAGNOSIS — F332 Major depressive disorder, recurrent severe without psychotic features: Secondary | ICD-10-CM | POA: Diagnosis not present

## 2023-09-28 NOTE — Progress Notes (Addendum)
[facility] Counselor/Therapist Progress Note  Patient ID: April Richardson, MRN: [id number],    Date: 09/28/2023  Time Spent: 8:33am - 9:14am : 41 minutes   Treatment Type: Individual Therapy  Reported Symptoms: Patient reported decreased energy.   Mental Status Exam: Appearance:  Neat and Well Groomed     Behavior: Appropriate  Motor: Normal  Speech/Language:  Clear and Coherent  Affect: Appropriate  Mood: normal  Thought process: normal  Thought content:   WNL  Sensory/Perceptual disturbances:   WNL  Orientation: oriented to person, place, and situation  Attention: Good  Concentration: Good  Memory: WNL  Fund of knowledge:  Good  Insight:   Good  Judgment:  Good  Impulse Control: Good   Risk Assessment: Danger to Self:  No Patient denied current suicidal ideation Self-injurious Behavior: No Danger to Others: No Patient denied current homicidal ideation Duty to Warn:no Physical Aggression / Violence:No  Access to Firearms a concern: No  Gang Involvement:No   Subjective: Patient stated, "they've been doing pretty well" in response to events since last session. Patient reported she is preparing for home renovations to begin next week. Patient stated, "I think I've been feeling ok" in response to patient's mood since last session. Patient stated, "I think my mood is pretty good today". Patient stated, "I didn't really keep a thought record so much, I didn't write it down" but reported she can recall a recent situation that occurred. Patient stated, "things in my household are not bothering me like they use to". Patient reported she feels her daughter likes to go through patient's belongings and dispose of patient's belongings. Patient reported her daughter recently went through some of patient's belongings. Patient reported she was able to go through the items patient's daughter set aside and decide what patient wanted to keep or dispose of. Patient reported  initially, patient's daughter going through patient's belongings triggered feelings of anger. Patient stated "I am having a hard time letting go of things I've had for many many years". Patient stated, "I think it's a hoarder mentality". Patient reported her belongings trigger memories that patient wants to hold on to from specific times in her life.   Interventions: Cognitive Behavioral Therapy. Clinician conducted session via caregility video from clinician's home office. Patient provided verbal consent to proceed with telehealth session and is aware of limitations of telephone or video visits. Patient participated in session from patient's home. Reviewed events since last session. Assessed patient's mood since last session and assessed patient's current mood. Reviewed patient's thought record. Assisted patient in discussing and identifying thoughts/feelings triggered by patient's daughter going through patient's belongings. Used clarification to better understand patient's experience and assist patient in discussing, exploring, and examining patient's thoughts and feelings related to the difficulty patient experiences letting go of objects. Explored strategies to utilize when patient is going through items in preparation for home renovations, such as, taking pictures of belongings that prompt a memory. Clinician requested for homework patient continue thought record.    Collaboration of Care: Other not required at this time   Diagnosis:  Severe episode of recurrent major depressive disorder, without psychotic features (HCC)     Plan: Patient is to utilize [employer] Therapy, thought re-framing, communication skills, and coping strategies to decrease symptoms associated with their diagnosis. Frequency: bi-weekly  Modality: individual      Long-term goal:   Develop and verbally express an understanding of the impact patient's behaviors and verbal communication have on others and patient's  relationships  with others Target Date: 08/04/24  Progress: progressing      Short-term goal:  Develop, implement, and maintain healthy communication strategies for patient to utilize when expressing her thoughts and feelings to others in a controlled and assertive way  Target Date: 02/05/24  Progress: progressing    Identify, challenge, and replace negative thought patterns that contribute to feelings of depression and feelings of hurt with positive thoughts, positive beliefs and positive self talk per patient's report Target Date: 02/05/24  Progress: progressing    Reduce overall level, frequency, and intensity of the feelings of depression as evidenced by decreased depressed mood, lack of energy, fatigue, loss of interest, decreased concentration, feelings of guilt, loses her train of thought during conversations, and difficulty recalling words in conversations/names from 2 days/week to 0 to 1 days/week per patient report for at least 3 consecutive months. Target Date: 02/05/24  Progress: progressing             Doree Barthel, LCSW

## 2023-09-28 NOTE — Progress Notes (Signed)
                Dezi Schaner, LCSW 

## 2023-09-29 ENCOUNTER — Encounter: Payer: Self-pay | Admitting: Internal Medicine

## 2023-09-29 ENCOUNTER — Encounter: Payer: Self-pay | Admitting: Psychiatry

## 2023-09-29 ENCOUNTER — Ambulatory Visit: Payer: Medicare HMO | Admitting: Psychiatry

## 2023-09-29 VITALS — BP 136/77 | HR 85 | Temp 97.1°F | Ht 62.0 in | Wt 190.0 lb

## 2023-09-29 DIAGNOSIS — G4730 Sleep apnea, unspecified: Secondary | ICD-10-CM | POA: Diagnosis not present

## 2023-09-29 DIAGNOSIS — F330 Major depressive disorder, recurrent, mild: Secondary | ICD-10-CM

## 2023-09-29 MED ORDER — VENLAFAXINE HCL ER 37.5 MG PO CP24: 37.5000 mg | ORAL_CAPSULE | Freq: Every day | ORAL | 1 refills | Status: DC

## 2023-09-29 MED ORDER — VENLAFAXINE HCL ER 150 MG PO CP24: 150.0000 mg | ORAL_CAPSULE | Freq: Every day | ORAL | 1 refills | Status: DC

## 2023-09-29 NOTE — Patient Instructions (Signed)
Continue venlafaxine 187.5 mg at night  Next appointment: 1/7 at 10 30, IP Referral for evaluation of sleep apnea

## 2023-10-12 ENCOUNTER — Ambulatory Visit: Payer: Medicare HMO | Admitting: Clinical

## 2023-10-26 ENCOUNTER — Ambulatory Visit (INDEPENDENT_AMBULATORY_CARE_PROVIDER_SITE_OTHER): Payer: Medicare HMO | Admitting: Clinical

## 2023-10-26 DIAGNOSIS — F332 Major depressive disorder, recurrent severe without psychotic features: Secondary | ICD-10-CM | POA: Diagnosis not present

## 2023-10-26 NOTE — Progress Notes (Signed)
                Dezi Schaner, LCSW 

## 2023-10-26 NOTE — Progress Notes (Signed)
[facility] Counselor/Therapist Progress Note  Patient ID: April Richardson, MRN: [id number],    Date: 10/26/2023  Time Spent: 8:31am - 9:13am : 42 minutes   Treatment Type: Individual Therapy  Reported Symptoms: Patient reported recent feelings of anxiety.   Mental Status Exam: Appearance:  Neat and Well Groomed     Behavior: Appropriate  Motor: Normal  Speech/Language:  Clear and Coherent  Affect: Appropriate  Mood: normal  Thought process: normal  Thought content:   WNL  Sensory/Perceptual disturbances:   WNL  Orientation: oriented to person, place, and situation  Attention: Good  Concentration: Good  Memory: WNL  Fund of knowledge:  Good  Insight:   Good  Judgment:  Good  Impulse Control: Good   Risk Assessment: Danger to Self:  No Patient denied current suicidal ideation  Self-injurious Behavior: No Danger to Others: No Patient denied current homicidal ideation Duty to Warn:no Physical Aggression / Violence:No  Access to Firearms a concern: No  Gang Involvement:No   Subjective: Patient reported she is currently living in her home during a renovation.  Patient stated, "its a challenge" in response to the home renovation.  Patient stated, "it's been going good" in response to events since last session. Patient reported she continues to feel anxious about the current political climate. Patient reported she has been focusing on activities she can participate in and that she enjoys. Patient stated, "I think my mood has been much better". Patient stated, "I have decided on January 20th I will be happy about that". Patient stated, "I did some positive things that helped me".  Patient stated, "my mood today is good". Patient reported no entries on her thought record since last session. Patient stated, "I think I'm in a really good place right now". Patient reported she plans to become involved in community activities. Patient reported she volunteered with  multiple organizations while living in [place].  Patient stated, "helping people is something I like doing". During today's session patient shared multiple volunteer opportunities patient is interested in.  Patient stated, "I didn't realize how much I had missed it" in reference to volunteering. Patient stated, "I didn't even realize I was doing it" in response to cognitive restructuring.    Interventions: Cognitive Behavioral Therapy. Clinician conducted session via caregility video from clinician's home office. Patient provided verbal consent to proceed with telehealth session and is aware of limitations of telephone or video visits. Patient participated in session from patient's home. Reviewed events since last session and status of recent stressors. Discussed recent triggers for anxiety and coping strategies patient has implemented in response. Assessed patient's mood since last session and assessed current mood. Reviewed patient's thought record. Provided psycho education related to depression and cognitive restructuring. Explored and identified ways in which patient has reframed negative thoughts and replaced with a positive. Discussed patient's decision to increase patient's involvement in the community through volunteerism.  Clinician requested for homework patient continue thought record.   Collaboration of Care: Other not required at this time   Diagnosis:  Severe episode of recurrent major depressive disorder, without psychotic features (HCC)     Plan: Patient is to utilize [employer] Therapy, thought re-framing, communication skills, and coping strategies to decrease symptoms associated with their diagnosis. Frequency: bi-weekly  Modality: individual      Long-term goal:   Develop and verbally express an understanding of the impact patient's behaviors and verbal communication have on others and patient's relationships with others Target Date: 08/04/24  Progress: progressing  Short-term goal:  Develop, implement, and maintain healthy communication strategies for patient to utilize when expressing her thoughts and feelings to others in a controlled and assertive way  Target Date: 02/05/24  Progress: progressing    Identify, challenge, and replace negative thought patterns that contribute to feelings of depression and feelings of hurt with positive thoughts, positive beliefs and positive self talk per patient's report Target Date: 02/05/24  Progress: progressing    Reduce overall level, frequency, and intensity of the feelings of depression as evidenced by decreased depressed mood, lack of energy, fatigue, loss of interest, decreased concentration, feelings of guilt, loses her train of thought during conversations, and difficulty recalling words in conversations/names from 2 days/week to 0 to 1 days/week per patient report for at least 3 consecutive months. Target Date: 02/05/24  Progress: progressing             Doree Barthel, LCSW

## 2023-11-06 ENCOUNTER — Emergency Department
Admission: EM | Admit: 2023-11-06 | Discharge: 2023-11-06 | Disposition: A | Discharge status: Home / Self Care | Payer: Medicare HMO | Attending: Emergency Medicine | Admitting: Emergency Medicine

## 2023-11-06 ENCOUNTER — Other Ambulatory Visit: Payer: Self-pay

## 2023-11-06 ENCOUNTER — Emergency Department: Payer: Medicare HMO

## 2023-11-06 ENCOUNTER — Encounter: Payer: Self-pay | Admitting: Emergency Medicine

## 2023-11-06 DIAGNOSIS — S0990XA Unspecified injury of head, initial encounter: Secondary | ICD-10-CM | POA: Diagnosis present

## 2023-11-06 DIAGNOSIS — Y92009 Unspecified place in unspecified non-institutional (private) residence as the place of occurrence of the external cause: Secondary | ICD-10-CM | POA: Insufficient documentation

## 2023-11-06 DIAGNOSIS — Z23 Encounter for immunization: Secondary | ICD-10-CM | POA: Insufficient documentation

## 2023-11-06 DIAGNOSIS — S0181XA Laceration without foreign body of other part of head, initial encounter: Secondary | ICD-10-CM

## 2023-11-06 DIAGNOSIS — W01198A Fall on same level from slipping, tripping and stumbling with subsequent striking against other object, initial encounter: Secondary | ICD-10-CM | POA: Insufficient documentation

## 2023-11-06 MED ORDER — TETANUS-DIPHTH-ACELL PERTUSSIS 5-2.5-18.5 LF-MCG/0.5 IM SUSY
0.5000 mL | PREFILLED_SYRINGE | Freq: Once | INTRAMUSCULAR | Status: AC
  Administered 2023-11-06: 0.5 mL via INTRAMUSCULAR
  Filled 2023-11-06: qty 0.5

## 2023-11-06 NOTE — ED Provider Notes (Signed)
[facility] Provider Note    Event Date/Time   First MD Initiated Contact with Patient 11/06/23 1129     (approximate)   History   Laceration and Fall   HPI  April Richardson is a 72 y.o. female who presents today for evaluation after mechanical trip and fall.  Patient reports that she tripped on uneven ground and fell forward onto her hands and knees and scraped her forehead on the gravel.  There was no LOC.  Patient was able to get herself up and call for her daughter.  She reports that she has pain to her forehead where she has a laceration only.  She has not had any nausea or vomiting.  No vision changes.  She reports that she takes a daily baby aspirin.  No neck pain or paresthesias/weakness.  She is unsure of her last tetanus shot.  There are no problems to display for this patient.         Physical Exam   Triage Vital Signs: ED Triage Vitals  Encounter Vitals Group     BP      Systolic BP Percentile      Diastolic BP Percentile      Pulse      Resp      Temp      Temp src      SpO2      Weight      Height      Head Circumference      Peak Flow      Pain Score      Pain Loc      Pain Education      Exclude from Growth Chart     Most recent vital signs: Vitals:   11/06/23 1133  BP: 137/69  Pulse: 60  Resp: 16  Temp: 98.1 F (36.7 C)  SpO2: 97%    Physical Exam Vitals and nursing note reviewed.  Constitutional:      General: Awake and alert. No acute distress.     Appearance: Normal appearance. The patient is normal weight.  HENT:     Head: Normocephalic. 1.5cm curvilinear laceration to forehead, no hematoma.  No Battle sign or raccoon eyes.    Mouth: Mucous membranes are moist.  Eyes:     General: PERRL. Normal EOMs        Right eye: No discharge.        Left eye: No discharge.     Conjunctiva/sclera: Conjunctivae normal.  Cardiovascular:     Rate and Rhythm: Normal rate and regular rhythm.     Pulses:  Normal pulses.  Pulmonary:     Effort: Pulmonary effort is normal. No respiratory distress.     Breath sounds: Normal breath sounds.  Abdominal:     Abdomen is soft. There is no abdominal tenderness. No rebound or guarding. No distention. Musculoskeletal:        General: No swelling. Normal range of motion.     Cervical back: Normal range of motion and neck supple.  Skin:    General: Skin is warm and dry.     Capillary Refill: Capillary refill takes less than 2 seconds.     Findings: No rash.  Neurological:     Mental Status: The patient is awake and alert.   Neurological: GCS 15 alert and oriented x3 Normal speech, no expressive or receptive aphasia or dysarthria Cranial nerves II through XII intact Normal visual fields 5 out of 5 strength in all  4 extremities with intact sensation throughout No extremity drift Normal finger-to-nose testing, no limb or truncal ataxia    ED Results / Procedures / Treatments   Labs (all labs ordered are listed, but only abnormal results are displayed) Labs Reviewed - No data to display   EKG     RADIOLOGY I independently reviewed and interpreted imaging and agree with radiologists findings.     PROCEDURES:  Critical Care performed:   Marland KitchenMarland KitchenLaceration Repair  Date/Time: 11/06/2023 2:18 PM  Performed by: Jackelyn Hoehn, PA-C Authorized by: Jackelyn Hoehn, PA-C   Consent:    Consent obtained:  Verbal   Consent given by:  Patient   Risks, benefits, and alternatives were discussed: yes     Risks discussed:  Infection, need for additional repair, nerve damage, pain, poor cosmetic result, poor wound healing, retained foreign body, tendon damage and vascular damage   Alternatives discussed:  No treatment Universal protocol:    Procedure explained and questions answered to patient or proxy's satisfaction: yes     Relevant documents present and verified: yes     Test results available: yes     Imaging studies available: yes      Required blood products, implants, devices, and special equipment available: yes     Site/side marked: yes     Immediately prior to procedure, a time out was called: yes     Patient identity confirmed:  Verbally with patient Anesthesia:    Anesthesia method:  None Laceration details:    Location:  Face   Face location:  Forehead   Length (cm):  1.5   Depth (mm):  1 Pre-procedure details:    Preparation:  Patient was prepped and draped in usual sterile fashion and imaging obtained to evaluate for foreign bodies Exploration:    Limited defect created (wound extended): no     Hemostasis achieved with:  Direct pressure   Imaging outcome: foreign body not noted     Wound exploration: wound explored through full range of motion and entire depth of wound visualized     Wound extent: areolar tissue not violated, fascia not violated, no foreign body, no signs of injury, no nerve damage, no tendon damage, no underlying fracture and no vascular damage     Contaminated: no   Treatment:    Area cleansed with:  Chlorhexidine and saline   Amount of cleaning:  Standard   Irrigation solution:  Sterile saline   Irrigation method:  Pressure wash   Debridement:  None   Undermining:  None   Scar revision: no   Skin repair:    Repair method:  Tissue adhesive and Steri-Strips   Number of Steri-Strips:  2 Approximation:    Approximation:  Close Repair type:    Repair type:  Simple Post-procedure details:    Dressing:  Open (no dressing)   Procedure completion:  Tolerated well, no immediate complications    MEDICATIONS ORDERED IN ED: Medications  Tdap (BOOSTRIX) injection 0.5 mL (0.5 mLs Intramuscular Given 11/06/23 1222)     IMPRESSION / MDM / ASSESSMENT AND PLAN / ED COURSE  I reviewed the triage vital signs and the nursing notes.   Differential diagnosis includes, but is not limited to, laceration, contusion, hematoma, intracranial hemorrhage, skull fracture, concussion, cervical spine  injury.  Patient is awake and alert, hemodynamically stable and neurologically and neurovascularly intact.  CT head and neck obtained per [place] criteria are negative for any acute injuries or findings.  Her laceration was cleaned  and closed with skin adhesive and Steri-Strips.  We discussed wound care.  Her tetanus was updated.  We discussed return precautions the importance of close outpatient follow-up.  Patient and her daughter understand and agree with plan.  She was discharged in stable condition.   Patient's presentation is most consistent with acute complicated illness / injury requiring diagnostic workup.   FINAL CLINICAL IMPRESSION(S) / ED DIAGNOSES   Final diagnoses:  Injury of head, initial encounter  Facial laceration, initial encounter     Rx / DC Orders   ED Discharge Orders     None        Note:  This document was prepared using Dragon voice recognition software and may include unintentional dictation errors.   Jackelyn Hoehn, PA-C 11/06/23 1420    Concha Se, MD 11/06/23 [phone]

## 2023-11-06 NOTE — Discharge Instructions (Signed)
Please keep your wound clean with soap and water.  Please return for any new, worsening, or change in symptoms or other concerns as we discussed.  It was a pleasure caring for you today.

## 2023-11-06 NOTE — ED Triage Notes (Signed)
Tripped and fell outside at home.  Fell onto right knee--a little sore.  Has lac on head.  Denies loc

## 2023-11-09 ENCOUNTER — Ambulatory Visit (INDEPENDENT_AMBULATORY_CARE_PROVIDER_SITE_OTHER): Payer: Medicare HMO | Admitting: Clinical

## 2023-11-09 DIAGNOSIS — F332 Major depressive disorder, recurrent severe without psychotic features: Secondary | ICD-10-CM

## 2023-11-09 NOTE — Progress Notes (Signed)
                Dezi Schaner, LCSW 
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
## 2023-11-23 ENCOUNTER — Ambulatory Visit (INDEPENDENT_AMBULATORY_CARE_PROVIDER_SITE_OTHER): Payer: Medicare HMO | Admitting: Clinical

## 2023-11-23 DIAGNOSIS — F332 Major depressive disorder, recurrent severe without psychotic features: Secondary | ICD-10-CM | POA: Diagnosis not present

## 2023-11-23 NOTE — Progress Notes (Signed)
[place] [facility] Counselor/Therapist Progress Note  Patient ID: April Richardson, MRN: [id number],    Date: 11/23/2023  Time Spent: 9:32am - 10:16am : 44 minutes  Treatment Type: Individual Therapy  Reported Symptoms: Patient reported loss of interest, loss of motivation, decreased energy, increased sleep, staying in bed.   Mental Status Exam: Appearance:  Neat and Well Groomed     Behavior: Appropriate  Motor: Normal  Speech/Language:  Clear and Coherent  Affect: Appropriate  Mood: dysthymic  Thought process: normal  Thought content:   WNL  Sensory/Perceptual disturbances:   WNL  Orientation: oriented to person, place, and situation  Attention: Good  Concentration: Good  Memory: WNL  Fund of knowledge:  Good  Insight:   Good  Judgment:  Good  Impulse Control: Good   Risk Assessment: Danger to Self:  No Patient denied current suicidal ideation  Self-injurious Behavior: No Danger to Others: No Patient denied current homicidal ideation Duty to Warn:no Physical Aggression / Violence:No  Access to Firearms a concern: No  Gang Involvement:No   Subjective: Patient stated, "well fine I think" in response to events since last session. Patient stated, "I don't feel like doing anything", "some days I don't even get dressed", "I feel bad that I'm just sitting here and not helping her (daughter) as much as I should or would like to". Patient stated,  "I'm not in a bad mood but I'm not in a good one either", "ok I'm here".  Patient stated, "its not something I paid a lot of intention too" in reference to patient's mood. Patient stated, "by the time I've acknowledged those feelings I've boiled over them". Patient stated, "I've had some ups and downs since then" , I know I have not gotten over it" in reference to the recent election. Patient identified the following goals to increase activity level: go for a 30 minute walk with the dog in the afternoons, getting dressed and  making patient's bed in the morning, crocheting for 30 minutes in the morning. Patient stated, "right now even that sounds overwhelming" in reference to behavioral activation goals.   Interventions: Cognitive Behavioral Therapy and behavioral activation.  Clinician conducted session via caregility video from clinician's home office. Patient provided verbal consent to proceed with telehealth session and is aware of limitations of telephone or video visits. Patient participated in session from patient's home. Reviewed events since last session. Assessed current symptoms. Provided psycho education related to self awareness and depressive symptoms. Assisted patient in exploring and identified triggers for depressive symptoms. Provided psycho education related to behavioral activation. Explored and identified goals to increase patient's activity level. Clinician requested for homework patient continue thought record and practice journaling.    Collaboration of Care: Other not required at this time   Diagnosis:  Severe episode of recurrent major depressive disorder, without psychotic features (HCC)     Plan: Patient is to utilize [employer] Therapy, thought re-framing, communication skills, and coping strategies to decrease symptoms associated with their diagnosis. Frequency: bi-weekly  Modality: individual      Long-term goal:   Develop and verbally express an understanding of the impact patient's behaviors and verbal communication have on others and patient's relationships with others Target Date: 08/04/24  Progress: progressing      Short-term goal:  Develop, implement, and maintain healthy communication strategies for patient to utilize when expressing her thoughts and feelings to others in a controlled and assertive way  Target Date: 02/05/24  Progress: progressing    Identify, challenge,  and replace negative thought patterns that contribute to feelings of depression and feelings of  hurt with positive thoughts, positive beliefs and positive self talk per patient's report Target Date: 02/05/24  Progress: progressing    Reduce overall level, frequency, and intensity of the feelings of depression as evidenced by decreased depressed mood, lack of energy, fatigue, loss of interest, decreased concentration, feelings of guilt, loses her train of thought during conversations, and difficulty recalling words in conversations/names from 2 days/week to 0 to 1 days/week per patient report for at least 3 consecutive months. Target Date: 02/05/24  Progress: progressing    Doree Barthel, LCSW

## 2023-11-23 NOTE — Progress Notes (Signed)
                Dezi Schaner, LCSW 

## 2023-12-07 ENCOUNTER — Ambulatory Visit: Payer: Medicare HMO | Admitting: Clinical

## 2023-12-07 DIAGNOSIS — F332 Major depressive disorder, recurrent severe without psychotic features: Secondary | ICD-10-CM

## 2023-12-07 NOTE — Progress Notes (Signed)
[place] [facility] Counselor/Therapist Progress Note  Patient ID: April Richardson, MRN: [id number],    Date: 12/07/2023  Time Spent: 8:34am - 9:23am : 49 minutes   Treatment Type: Individual Therapy  Reported Symptoms: depressed mood, staying in bed   Mental Status Exam: Appearance:  Neat and Well Groomed     Behavior: Appropriate  Motor: Normal  Speech/Language:  Clear and Coherent  Affect: Appropriate  Mood: depressed  Thought process: normal  Thought content:   WNL  Sensory/Perceptual disturbances:   WNL  Orientation: oriented to person, place, and situation  Attention: Good  Concentration: Good  Memory: WNL  Fund of knowledge:  Good  Insight:   Good  Judgment:  Good  Impulse Control: Good   Risk Assessment: Danger to Self:  No Patient denied current suicidal ideation  Self-injurious Behavior: No Danger to Others: No Patient denied current homicidal ideation Duty to Warn:no Physical Aggression / Violence:No  Access to Firearms a concern: No  Gang Involvement:No   Subjective: Patient stated, "they've been going ok" in response to events since last session.  Patient reported kitchen renovations are complete and stated, "its nice to be back to our normal". Patient reported patient/family will be starting additional renovations next year. Patient stated, "politics is still heavy on my mind" and stated, "I've lost some friends and do not want them back". Patient reported she has ended several friendships due to politics. Patient stated, "it hurts my heart" in response to the recent loss of friendships. Patient reported some of those friendships are also family members. Patient stated, "I've been down a lot", "I'm not functioning well at the present time, I hardly get out of bed" in response to patient's mood. Patient stated, "I did follow through on some of them", "and that bothers me too", "I will get there I know I will" in reference to behavioral activation  goals. Patient reported she plans to take herself out to breakfast this morning, visit the yarn shop, and go grocery shopping. Patient stated, "I guess I'm not moving as fast as I want to be" in reference to behavioral activation goals. Patient reported she works 2 days a week and stated, "I love going to work". Patient stated, "if I can give myself a reason to do it" in regard to getting out of bed each day.   Interventions: Cognitive Behavioral Therapy. Clinician conducted session via caregility video from clinician's home office. Patient provided verbal consent to proceed with telehealth session and is aware of limitations of telephone or video visits. Patient participated in session from patient's home. Reviewed events since last session. Discussed patient's decision to end several friendships, explored the impact on patient's mood, and explored thoughts/feelings triggered by recent loss of friendships. Provided psycho education related to boundaries. Provided psycho education related to grief and loss. Assessed patient's mood since last session. Reviewed behavioral activation goals discussed last session and patient's progress. Provided psycho education related to behavioral activation and depressive symptoms. Clinician requested for homework patient continue thought record and practice journaling.    Collaboration of Care: Other not required at this time   Diagnosis:  Severe episode of recurrent major depressive disorder, without psychotic features (HCC)     Plan: Patient is to utilize [employer] Therapy, thought re-framing, communication skills, and coping strategies to decrease symptoms associated with their diagnosis. Frequency: bi-weekly  Modality: individual      Long-term goal:   Develop and verbally express an understanding of the impact patient's behaviors and verbal communication  have on others and patient's relationships with others Target Date: 08/04/24  Progress:  progressing      Short-term goal:  Develop, implement, and maintain healthy communication strategies for patient to utilize when expressing her thoughts and feelings to others in a controlled and assertive way  Target Date: 02/05/24  Progress: progressing    Identify, challenge, and replace negative thought patterns that contribute to feelings of depression and feelings of hurt with positive thoughts, positive beliefs and positive self talk per patient's report Target Date: 02/05/24  Progress: progressing    Reduce overall level, frequency, and intensity of the feelings of depression as evidenced by decreased depressed mood, lack of energy, fatigue, loss of interest, decreased concentration, feelings of guilt, loses her train of thought during conversations, and difficulty recalling words in conversations/names from 2 days/week to 0 to 1 days/week per patient report for at least 3 consecutive months. Target Date: 02/05/24  Progress: progressing    Doree Barthel, LCSW

## 2023-12-07 NOTE — Progress Notes (Signed)
                Dezi Schaner, LCSW 

## 2023-12-21 ENCOUNTER — Ambulatory Visit (INDEPENDENT_AMBULATORY_CARE_PROVIDER_SITE_OTHER): Payer: Medicare HMO | Admitting: Clinical

## 2023-12-21 DIAGNOSIS — F332 Major depressive disorder, recurrent severe without psychotic features: Secondary | ICD-10-CM

## 2023-12-21 NOTE — Progress Notes (Signed)
[facility] Counselor/Therapist Progress Note  Patient ID: April Richardson, MRN: [id number],    Date: 12/21/2023  Time Spent: 8:31am - 9:25am : 54 minutes  Treatment Type: Individual Therapy  Reported Symptoms: Patient reported feelings of anger related to patient's relationship with patient's son  Mental Status Exam: Appearance:  Neat and Well Groomed     Behavior: Appropriate  Motor: Normal  Speech/Language:  Clear and Coherent  Affect: Appropriate  Mood: normal  Thought process: normal  Thought content:   WNL  Sensory/Perceptual disturbances:   WNL  Orientation: oriented to person, place, and situation  Attention: Good  Concentration: Good  Memory: WNL  Fund of knowledge:  Good  Insight:   Good  Judgment:  Good  Impulse Control: Good   Risk Assessment: Danger to Self:  No Patient denied current suicidal ideation  Self-injurious Behavior: No Danger to Others: No Patient denied current homicidal ideation Duty to Warn:no Physical Aggression / Violence:No  Access to Firearms a concern: No  Gang Involvement:No   Subjective: Patient reported she recently went shopping and stated, "it was nice to get out and do some things I wanted to do". Patient reported she recently completed crocheting an afghan blanket patient has been working on for a year and is planning to start another crocheting project today. Patient reported she also attended a movie with her family during the recent holiday.  Patient stated, "everything's going pretty well" in response to events since last session. Patient stated,  "I think my mood has been good". Patient stated, "I've tried not to dwell on that its been years since I've seen my son and grandchildren". Patient stated,  "I miss him (son) but I'm still angry". Patient stated, "I'm angry with him (son), he took my grandchildren". Patient reported patient was asked to spend the summer with patient's son's family and when patient was  to return home patient's son told patient she was not return without being invited. Patient stated, "its always rocky" in reference to patient's relationship with her son's wife. Patient reported there was a conflict between patient's son and patient's daughter regarding son's daughter attending a parade prior to son terminating communication with patient and siblings.   Interventions: Cognitive Behavioral Therapy and Interpersonal. Clinician conducted session via caregility video from clinician's home office. Patient provided verbal consent to proceed with telehealth session and is aware of limitations of telephone or video visits. Patient participated in session from patient's home. Reviewed events since last session and discussed patient's recent activities. Assessed patient's mood since last session and current mood. Explored patient's thoughts and feelings related to patient's relationship with her son and events that precipitated patient's son terminating communication with patient/siblings. Discussed patient writing a letter to patient's son to express patient's thoughts/feelings. Clinician requested for homework patient continue thought record and practice journaling.     Collaboration of Care: Other not required at this time   Diagnosis:  Severe episode of recurrent major depressive disorder, without psychotic features (HCC)     Plan: Patient is to utilize [employer] Therapy, thought re-framing, communication skills, and coping strategies to decrease symptoms associated with their diagnosis. Frequency: bi-weekly  Modality: individual      Long-term goal:   Develop and verbally express an understanding of the impact patient's behaviors and verbal communication have on others and patient's relationships with others Target Date: 08/04/24  Progress: progressing      Short-term goal:  Develop, implement, and maintain healthy communication strategies for patient to utilize  when  expressing her thoughts and feelings to others in a controlled and assertive way  Target Date: 02/05/24  Progress: progressing    Identify, challenge, and replace negative thought patterns that contribute to feelings of depression and feelings of hurt with positive thoughts, positive beliefs and positive self talk per patient's report Target Date: 02/05/24  Progress: progressing    Reduce overall level, frequency, and intensity of the feelings of depression as evidenced by decreased depressed mood, lack of energy, fatigue, loss of interest, decreased concentration, feelings of guilt, loses her train of thought during conversations, and difficulty recalling words in conversations/names from 2 days/week to 0 to 1 days/week per patient report for at least 3 consecutive months. Target Date: 02/05/24  Progress: progressing    Doree Barthel, LCSW

## 2023-12-21 NOTE — Progress Notes (Signed)
                Dezi Schaner, LCSW 
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
## 2023-12-29 ENCOUNTER — Ambulatory Visit: Payer: Medicare HMO | Admitting: Psychiatry

## 2023-12-29 ENCOUNTER — Encounter: Payer: Self-pay | Admitting: Psychiatry

## 2023-12-29 VITALS — BP 126/86 | HR 88 | Temp 99.2°F | Ht 62.0 in | Wt 196.6 lb

## 2023-12-29 DIAGNOSIS — R4189 Other symptoms and signs involving cognitive functions and awareness: Secondary | ICD-10-CM

## 2023-12-29 DIAGNOSIS — F330 Major depressive disorder, recurrent, mild: Secondary | ICD-10-CM | POA: Diagnosis not present

## 2023-12-29 MED ORDER — BUPROPION HCL 75 MG PO TABS: 75.0000 mg | ORAL_TABLET | Freq: Every day | ORAL | 1 refills | Status: DC

## 2023-12-29 NOTE — Patient Instructions (Signed)
 Continue venlafaxine 187.5 mg at night  Start bupropion 75 mg daily  Next appointment: 2/18 at 10:30

## 2023-12-30 ENCOUNTER — Telehealth: Payer: Self-pay

## 2023-12-30 ENCOUNTER — Other Ambulatory Visit: Payer: Self-pay | Admitting: Psychiatry

## 2023-12-30 MED ORDER — BUPROPION HCL 75 MG PO TABS: 75.0000 mg | ORAL_TABLET | Freq: Every day | ORAL | 1 refills | Status: DC

## 2023-12-30 NOTE — Telephone Encounter (Signed)
 pt daughter called [facility] [address] [facility] not [facility] the [facility] for the new medication yet. (bupropion). told her that [facility] was [facility]. she states that [facility] and they needed it sent to [facility]

## 2023-12-30 NOTE — Telephone Encounter (Signed)
 left message that rx had been sent to the pharmacy

## 2023-12-30 NOTE — Telephone Encounter (Signed)
Called and canceled rx  

## 2023-12-30 NOTE — Telephone Encounter (Signed)
 Order sent to [facility]

## 2024-01-04 ENCOUNTER — Ambulatory Visit (INDEPENDENT_AMBULATORY_CARE_PROVIDER_SITE_OTHER): Payer: Medicare HMO | Admitting: Clinical

## 2024-01-04 DIAGNOSIS — F332 Major depressive disorder, recurrent severe without psychotic features: Secondary | ICD-10-CM | POA: Diagnosis not present

## 2024-01-04 NOTE — Progress Notes (Signed)
   April Barthel, LCSW
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
## 2024-01-18 ENCOUNTER — Ambulatory Visit (INDEPENDENT_AMBULATORY_CARE_PROVIDER_SITE_OTHER): Payer: Medicare HMO | Admitting: Clinical

## 2024-01-18 DIAGNOSIS — F332 Major depressive disorder, recurrent severe without psychotic features: Secondary | ICD-10-CM | POA: Diagnosis not present

## 2024-01-18 NOTE — Progress Notes (Signed)
Doree Barthel, LCSW

## 2024-01-18 NOTE — Progress Notes (Signed)
[facility] Counselor/Therapist Progress Note  Patient ID: Eathel Pajak, MRN: [id number],    Date: 01/18/2024  Time Spent: 8:33am - 9:21am : 49 minutes  Treatment Type: Individual Therapy  Reported Symptoms: Patient reported a recent decrease in feelings of anger and increase in activity level  Mental Status Exam: Appearance:  Neat and Well Groomed     Behavior: Appropriate  Motor: Normal  Speech/Language:  Clear and Coherent  Affect: Appropriate  Mood: normal  Thought process: normal  Thought content:   WNL  Sensory/Perceptual disturbances:   WNL  Orientation: oriented to person, place, and situation  Attention: Good  Concentration: Good  Memory: WNL  Fund of knowledge:  Good  Insight:   Good  Judgment:  Good  Impulse Control: Good   Risk Assessment: Danger to Self:  No Patient denied current suicidal ideation  Self-injurious Behavior: No Danger to Others: No Patient denied current homicidal ideation Duty to Warn:no Physical Aggression / Violence:No  Access to Firearms a concern: No  Gang Involvement:No   Subjective: Patient reported she spent time out of her house after last session and stated, "I had a nice day". Patient stated, "I'm doing well I think". Patient stated, "the anger is beginning to subside a little bit", "just a day at a time". Patient reported she completed several chores last week and over the course of the weekend. Patient stated, "I cooked a couple of meals" and reported she started laundry. Patient stated, "I feel good about what I did get accomplished". Patient stated, "they're coming a long slowly but we're getting there" in reference to tasks. Patient stated, "I feel good about them" in response to completed tasks. Patient reported she has been getting up and getting dressed every day and stated, "I feel like doing something" in response to getting dressed each day. Patient reported she rejoined a stained glass class and will  have the opportunity to visit with friends during the class. Patient reported several of the friends patient ended friendships with will be attending the class. Patient reported she can step out of the class if she begins to feel angry.  Patient stated, "I feel the anger spread" and stated, "its like a color that hits my brain like red". Patient stated, "I could speak to that person in my brain". Patient stated, "generally it takes a while to remove that color". Patient stated, "actually my mood is good today". Patient stated, "my thought record contains mostly political stuff".    Interventions: Cognitive Behavioral Therapy. Clinician conducted session via caregility video from clinician's home office. Patient provided verbal consent to proceed with telehealth session and is aware of limitations of telephone or video visits. Patient participated in session from patient's home. Reviewed events since last session. Discussed patient's recent activity level, tasks completed, and patient's thoughts/feelings in response. Discussed potential triggers for anger. Assisted patient in exploring and identifying warning signs for anger. Explored and identified coping strategies for patient to utilize in response to feelings of anger, such as, leaving the class until anger decreases, going for a walk, going into the store next door, squeezing a ball of yarn, talking to a friend in the class. Provided psycho education related to deep breathing. Assessed patient's mood. Reviewed patient's thought record. Discussed patient journaling patient's thoughts/feelings related to politics. Clinician requested for homework patient continue thought record and practice journaling.    Collaboration of Care: Other not required at this time   Diagnosis:  Severe episode of recurrent major depressive disorder,  without psychotic features [facility])     Plan: Patient is to utilize [employer], thought re-framing, communication  skills, and coping strategies to decrease symptoms associated with their diagnosis. Frequency: bi-weekly  Modality: individual      Long-term goal:   Develop and verbally express an understanding of the impact patient's behaviors and verbal communication have on others and patient's relationships with others Target Date: 08/04/24  Progress: progressing      Short-term goal:  Develop, implement, and maintain healthy communication strategies for patient to utilize when expressing her thoughts and feelings to others in a controlled and assertive way  Target Date: 02/05/24  Progress: progressing    Identify, challenge, and replace negative thought patterns that contribute to feelings of depression and feelings of hurt with positive thoughts, positive beliefs and positive self talk per patient's report Target Date: 02/05/24  Progress: progressing    Reduce overall level, frequency, and intensity of the feelings of depression as evidenced by decreased depressed mood, lack of energy, fatigue, loss of interest, decreased concentration, feelings of guilt, loses her train of thought during conversations, and difficulty recalling words in conversations/names from 2 days/week to 0 to 1 days/week per patient report for at least 3 consecutive months. Target Date: 02/05/24  Progress: progressing       Doree Barthel, LCSW

## 2024-01-22 ENCOUNTER — Other Ambulatory Visit: Payer: Self-pay | Admitting: Psychiatry

## 2024-02-01 ENCOUNTER — Encounter: Payer: Self-pay | Admitting: Internal Medicine

## 2024-02-01 ENCOUNTER — Encounter

## 2024-02-01 ENCOUNTER — Ambulatory Visit: Payer: Medicare HMO | Admitting: Clinical

## 2024-02-01 ENCOUNTER — Ambulatory Visit (INDEPENDENT_AMBULATORY_CARE_PROVIDER_SITE_OTHER): Payer: Medicare HMO | Admitting: Internal Medicine

## 2024-02-01 VITALS — BP 116/76 | HR 77 | Temp 97.6°F | Ht 62.0 in | Wt 195.0 lb

## 2024-02-01 DIAGNOSIS — F332 Major depressive disorder, recurrent severe without psychotic features: Secondary | ICD-10-CM

## 2024-02-01 DIAGNOSIS — G4733 Obstructive sleep apnea (adult) (pediatric): Secondary | ICD-10-CM

## 2024-02-01 DIAGNOSIS — G4710 Hypersomnia, unspecified: Secondary | ICD-10-CM | POA: Diagnosis not present

## 2024-02-01 NOTE — Progress Notes (Signed)
 [facility] Counselor/Therapist Progress Note  Patient ID: April Richardson, MRN: [id number],    Date: 02/01/2024  Time Spent: 8:34am - 9:15am : 41 minutes   Treatment Type: Individual Therapy  Reported Symptoms: Patient reported recent feelings of anger  Mental Status Exam: Appearance:  Neat and Well Groomed     Behavior: Appropriate  Motor: Normal  Speech/Language:  Clear and Coherent and Normal Rate  Affect: Appropriate  Mood: normal  Thought process: normal  Thought content:   WNL  Sensory/Perceptual disturbances:   WNL  Orientation: oriented to person, place, and situation  Attention: Good  Concentration: Good  Memory: WNL  Fund of knowledge:  Good  Insight:   Good  Judgment:  Good  Impulse Control: Good   Risk Assessment: Danger to Self:  No Patient denied current suicidal ideation  Self-injurious Behavior: No Danger to Others: No Patient denied current homicidal ideation Duty to Warn:no Physical Aggression / Violence:No  Access to Firearms a concern: No  Gang Involvement:No   Subjective: Patient reported she did not attend the stained glass class on Wednesday as patient has planned. Patient reported she got dressed to attend the class on Wednesday but did not go.  Patient reported she works on Saturdays and stated "Saturdays take a toll on my nerves".  Patient reported one of the class's attendees expressed her political beliefs and patient reported classmate's political beliefs are barriers to patient attending the classes. Patient stated, "up until this time I have not, not been able to tolerate" and reported she is no longer able to tolerate other's statements regarding current politics.  Patient stated, "I refuse to acknowledge those people" in reference to individuals that do not share the same political beliefs as patient. Patient reported she asked her friends on social media to leave her page if they voted opposite of patient in the election  and patient reported she blocked individuals who did not leave her page on social media. Patient reported feelings of anger on Saturday towards a class attendee and patient reported she asked the class attendee to leave her space. Patient stated, "I think it's been good" in response to patient's mood since last session.  Patient stated,  "I haven't done it yet" in response to breaking goals into smaller steps. Patient requested to end session early today due to a conflicting appointment.   Interventions: Cognitive Behavioral Therapy. Clinician conducted session via caregility video from clinician's home office. Patient provided verbal consent to proceed with telehealth session and is aware of limitations of telephone or video visits. Patient participated in session from patient's home. Reviewed events since last session. Assisted patient in exploring barriers to patient attending the class on Wednesday and Saturday. Provided psycho education related to depression and inactivity. Assisted patient in examining ways in which patient's political beliefs and decisions regarding others' beliefs have impacted activities patient enjoys.   Assisted patient in exploring strategies to express patient's feelings regarding communication with a class attendee. Assessed patient's mood since last session and current mood. Reviewed breaking goals into smaller steps. Clinician requested for homework patient continue thought record and practice journaling.     Collaboration of Care: Other not required at this time   Diagnosis:  Severe episode of recurrent major depressive disorder, without psychotic features (HCC)     Plan: Patient is to utilize [employer] Therapy, thought re-framing, communication skills, and coping strategies to decrease symptoms associated with their diagnosis. Frequency: bi-weekly  Modality: individual      Long-term  goal:   Develop and verbally express an understanding of the impact  patient's behaviors and verbal communication have on others and patient's relationships with others Target Date: 08/04/24  Progress: progressing      Short-term goal:  Develop, implement, and maintain healthy communication strategies for patient to utilize when expressing her thoughts and feelings to others in a controlled and assertive way  Target Date: 02/05/24  Progress: progressing    Identify, challenge, and replace negative thought patterns that contribute to feelings of depression and feelings of hurt with positive thoughts, positive beliefs and positive self talk per patient's report Target Date: 02/05/24  Progress: progressing    Reduce overall level, frequency, and intensity of the feelings of depression as evidenced by decreased depressed mood, lack of energy, fatigue, loss of interest, decreased concentration, feelings of guilt, loses her train of thought during conversations, and difficulty recalling words in conversations/names from 2 days/week to 0 to 1 days/week per patient report for at least 3 consecutive months. Target Date: 02/05/24  Progress: progressing     Burlene Carpen, LCSW

## 2024-02-01 NOTE — Progress Notes (Signed)
   Doree Barthel, LCSW

## 2024-02-01 NOTE — Patient Instructions (Signed)
 Recommend Home SLeep Test to assess for sleep apnea  Avoid Allergens and Irritants Avoid secondhand smoke Avoid SICK contacts Recommend  Masking  when appropriate Recommend Keep up-to-date with vaccinations   Be aware of reduced alertness and do not drive or operate heavy machinery if experiencing this or drowsiness.  Exercise encouraged, as tolerated. Encouraged proper weight management.  Important to get eight or more hours of sleep  Limiting the use of the computer and television before bedtime.  Decrease naps during the day, so night time sleep will become enhanced.  Limit caffeine, and sleep deprivation.

## 2024-02-01 NOTE — Progress Notes (Signed)
 Name: April Richardson MRN: [id number] DOB: 1951-10-31    CHIEF COMPLAINT:  EXCESSIVE DAYTIME SLEEPINESS Assessment for sleep apnea   HISTORY OF PRESENT ILLNESS: Patient is seen today for problems and issues with sleep related to excessive daytime sleepiness Patient  has been having sleep problems for many years Patient has been having excessive daytime sleepiness for a long time Patient has been having extreme fatigue and tiredness, lack of energy +  very Loud snoring every night + struggling breathe at night and gasps for air + morning headaches + Nonrefreshing sleep  Patient has a diagnosis of sleep apnea She had been prescribed CPAP machine Has not worn the machine in 5 years Daughter is at office visit concerned about her fatigue and daytime sleepiness Patient takes long naps during the day Patient excessive daytime sleepiness and tiredness Low energy    Discussed sleep data and reviewed with patient.  Encouraged proper weight management.  Discussed driving precautions and its relationship with hypersomnolence.  Discussed operating dangerous equipment and its relationship with hypersomnolence.  Discussed sleep hygiene, and benefits of a fixed sleep waked time.  The importance of getting eight or more hours of sleep discussed with patient.  Discussed limiting the use of the computer and television before bedtime.  Decrease naps during the day, so night time sleep will become enhanced.  Limit caffeine, and sleep deprivation.  HTN, stroke, and heart failure are potential risk factors.    EPWORTH SLEEP SCORE 10   No evidence of heart failure at this time No evidence or signs of infection at this time No respiratory distress No fevers, chills, nausea, vomiting, diarrhea No evidence of lower extremity edema No evidence hemoptysis   PAST MEDICAL HISTORY :   has a past medical history of Arthritis, Depression, Diabetes mellitus without complication (HCC),  Hyperlipidemia, and Hypertension.  has a past surgical history that includes Cesarean section; Abdominal hysterectomy; and Back surgery. Prior to Admission medications   Medication Sig Start Date End Date Taking? Authorizing Provider  amLODipine (NORVASC) 10 MG tablet Take 10 mg by mouth daily.    [provider]  aspirin EC 81 MG tablet Take 80 mg by mouth daily as needed.    [provider]  buPROPion  (WELLBUTRIN ) 75 MG tablet Take 1 tablet (75 mg total) by mouth daily. 01/25/24 04/24/24  Todd Fossa, MD  carvedilol (COREG) 25 MG tablet Take 25 mg by mouth 2 (two) times daily.    [provider]  Cholecalciferol (VITAMIN D-1000 MAX ST) 25 MCG (1000 UT) tablet Take 5,000 Units by mouth daily.    [provider]  ezetimibe (ZETIA) 10 MG tablet Take 10 mg by mouth daily. 07/31/19   [provider]  fenofibrate (TRICOR) 145 MG tablet Take 145 mg by mouth daily.    [provider]  FERREX 150 150 MG capsule Take 150 mg by mouth daily. 10/14/19   [provider]  metFORMIN (GLUCOPHAGE) 500 MG tablet Take 1,000 mg by mouth 2 (two) times daily with a meal.     [provider]  Semaglutide (OZEMPIC, 0.25 OR 0.5 MG/DOSE, [place]) Inject into the skin.    [provider]  valsartan (DIOVAN) 80 MG tablet Take 80 mg by mouth daily.  07/31/19   [provider]  venlafaxine  XR (EFFEXOR  XR) 37.5 MG 24 hr capsule Take 1 capsule (37.5 mg total) by mouth daily with breakfast. Take a total of 187.5 mg daily. Take along with 150 mg cap 04/14/23 05/14/23  Hisada, Reina, MD  venlafaxine  XR (EFFEXOR -XR) 150 MG 24 hr capsule Take 1 capsule (150 mg total) by mouth daily with breakfast. 04/21/23 05/21/23  Todd Fossa, MD  venlafaxine  XR (EFFEXOR -XR) 150 MG 24 hr capsule Take 1 capsule (150 mg total) by mouth daily with breakfast. Take a total of 187.5 mg daily. Take along with 37.5 mg cap 11/13/23 05/11/24  Todd Fossa, MD  venlafaxine  XR  (EFFEXOR -XR) 37.5 MG 24 hr capsule Take 1 capsule (37.5 mg total) by mouth daily. Take a total of 187.5 mg daily. Take along with 150 mg cap 11/18/23 05/16/24  Todd Fossa, MD   No Known Allergies  FAMILY HISTORY:  family history includes Diabetes in her mother. SOCIAL HISTORY:  reports that she quit smoking about 15 years ago. Her smoking use included cigarettes. She started smoking about 30 years ago. She has a 15 pack-year smoking history. She has never used smokeless tobacco. She reports that she does not currently use alcohol. She reports current drug use.   Review of Systems:  Gen:  Denies  fever, sweats, chills weight loss  HEENT: Denies blurred vision, double vision, ear pain, eye pain, hearing loss, nose bleeds, sore throat Cardiac:  No dizziness, chest pain or heaviness, chest tightness,edema, No JVD Resp:   No cough, -sputum production, -shortness of breath,-wheezing, -hemoptysis,  Gi: Denies swallowing difficulty, stomach pain, nausea or vomiting, diarrhea, constipation, bowel incontinence Gu:  Denies bladder incontinence, burning urine Ext:   Denies Joint pain, stiffness or swelling Skin: Denies  skin rash, easy bruising or bleeding or hives Endoc:  Denies polyuria, polydipsia , polyphagia or weight change Psych:   Denies depression, insomnia or hallucinations  Other:  All other systems negative   ALL OTHER ROS ARE NEGATIVE   BP 116/76 (BP Location: Left Arm, Patient Position: Sitting, Cuff Size: Normal)   Pulse 77   Temp 97.6 F (36.4 C) (Temporal)   Ht 5\' 2"  (1.575 m)   Wt 195 lb (88.5 kg)   SpO2 97%   BMI 35.67 kg/m     Physical Examination:   General Appearance: No distress  EYES PERRLA, EOM intact.   NECK Supple, No JVD ORAL CAVITY MALLAMPATI 4 Pulmonary: normal breath sounds, No wheezing.  CardiovascularNormal S1,S2.  No m/r/g.   Abdomen: Benign, Soft, non-tender. Skin:   warm, no rashes, no ecchymosis  Extremities: normal, no cyanosis,  clubbing. Neuro:without focal findings,  speech normal  PSYCHIATRIC: Mood, affect within normal limits.   ALL OTHER ROS ARE NEGATIVE    ASSESSMENT AND PLAN SYNOPSIS  73 year old obese patient with signs and symptoms of excessive daytime sleepiness with probable underlying diagnosis of obstructive sleep apnea in the setting of obesity and deconditioned state   Recommend Sleep Study for definitve diagnosis Be aware of reduced alertness and do not drive or operate heavy machinery if experiencing this or drowsiness.  Exercise encouraged, as tolerated. Encouraged proper weight management.  Important to get eight or more hours of sleep  Limiting the use of the computer and television before bedtime.  Decrease naps during the day, so night time sleep will become enhanced.  Limit caffeine, and sleep deprivation.  HTN, stroke, uncontrolled diabetes and heart failure are potential risk factors.  Risk of untreated sleep apnea including cardiac arrhthymias, stroke, DM, pulm HTN.     Obesity -recommend significant weight loss -recommend changing diet  Deconditioned state -Recommend increased daily activity and exercise   MEDICATION ADJUSTMENTS/LABS AND TESTS ORDERED: Recommend Sleep Study Recommend weight loss Avoid  Allergens and Irritants Avoid secondhand smoke Avoid SICK contacts Recommend  Masking  when appropriate Recommend Keep up-to-date with vaccinations   CURRENT MEDICATIONS REVIEWED AT LENGTH WITH PATIENT TODAY   Patient  satisfied with Plan of action and management. All questions answered  Follow up 6 weeks  I spent a total of  61 minutes reviewing chart data, face-to-face evaluation with the patient, counseling and coordination of care as detailed above.    Lady Pier, M.D.  [facility]  Medical Director [facility] Medical Director [facility] Cardio-Pulmonary Department
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
## 2024-02-09 ENCOUNTER — Ambulatory Visit: Payer: Medicare HMO | Admitting: Psychiatry

## 2024-02-09 ENCOUNTER — Encounter: Payer: Self-pay | Admitting: Psychiatry

## 2024-02-09 VITALS — BP 118/70 | HR 94 | Temp 97.3°F | Ht 62.0 in | Wt 189.6 lb

## 2024-02-09 DIAGNOSIS — F330 Major depressive disorder, recurrent, mild: Secondary | ICD-10-CM

## 2024-02-09 MED ORDER — BUPROPION HCL 75 MG PO TABS: 75.0000 mg | ORAL_TABLET | Freq: Every day | ORAL | 0 refills | Status: DC

## 2024-02-15 ENCOUNTER — Ambulatory Visit (INDEPENDENT_AMBULATORY_CARE_PROVIDER_SITE_OTHER): Payer: Medicare HMO | Admitting: Clinical

## 2024-02-15 DIAGNOSIS — F332 Major depressive disorder, recurrent severe without psychotic features: Secondary | ICD-10-CM | POA: Diagnosis not present

## 2024-02-15 NOTE — Progress Notes (Signed)
 [facility] Counselor/Therapist Progress Note  Patient ID: April Richardson, MRN: [id number],    Date: 02/15/2024  Time Spent: 8:32am - 9:51am : 19 minutes  Treatment Type: Individual Therapy  Reported Symptoms: Patient reported weakness, nausea, and diarrhea  Mental Status Exam: Appearance:  Neat and Well Groomed     Behavior: Appropriate  Motor: Normal  Speech/Language:  Clear and Coherent and Normal Rate  Affect: Appropriate  Mood: normal  Thought process: normal  Thought content:   WNL  Sensory/Perceptual disturbances:   Patient reported feeling weak and nausea  Orientation: oriented to person, place, and situation  Attention: Good  Concentration: Good  Memory: WNL  Fund of knowledge:  Good  Insight:   Good  Judgment:  Good  Impulse Control: Good   Risk Assessment: Danger to Self:  No Patient denied current suicidal ideation  Self-injurious Behavior: No Danger to Others: No Patient denied current homicidal ideation Duty to Warn:no Physical Aggression / Violence:No  Access to Firearms a concern: No  Gang Involvement:No   Subjective: Patient stated, "since we last spoke I've been very sick". Patient reported her daughter believes patient had a response to an antibiotic patient was taking and patient reported she continues to feel weak, nauseated, and experiencing diarrhea. Patient stated, "it's just not been fun" in reference to recent illness. Patient reported she refrains from taking antibiotics frequently due to patient's mother's experience taking antibiotics. Patient stated, "I guess my mood is ok". Patient stated, "all I want to do is get well". Patient stated, "I don't feel like I've progressed very honest in that" in response to patient's first short term goal. Patient stated, "I think maybe at least in the last week I may have progressed a little bit on that", "I haven't been watching a lot of news shows, I've been watching movies in bed, I have  not been on Facebook" in response to patient's second short term goal. Patient stated, "not really" in response to progression towards patient's third short term goal. Patient stated, "I was getting up and getting dressed", "would go get groceries, take myself out to lunch, make a meal at night, that made me feel better".   Patient requested to end session early today due to patient not feeling well.   Interventions: Motivational Interviewing and supportive therapy . Clinician conducted session via caregility video from clinician's home office. Patient provided verbal consent to proceed with telehealth session and is aware of limitations of telephone or video visits. Patient participated in session from patient's home. Reviewed events since last session. Provided supportive therapy, active listening, and validation as patient discussed recent illness and current symptoms. Discussed patient calling patient's PCP to discuss current symptoms. Assessed patient's mood since last session. Reviewed patient's short term goals for therapy and patient's progress.     Collaboration of Care: Other not required at this time   Diagnosis:  Severe episode of recurrent major depressive disorder, without psychotic features (HCC)     Plan: Patient is to utilize [employer] Therapy, thought re-framing, communication skills, and coping strategies to decrease symptoms associated with their diagnosis. Frequency: bi-weekly  Modality: individual      Long-term goal:   Develop and verbally express an understanding of the impact patient's behaviors and verbal communication have on others and patient's relationships with others Target Date: 08/04/24  Progress: progressing      Short-term goal:  Develop, implement, and maintain healthy communication strategies for patient to utilize when expressing her thoughts and feelings to  others in a controlled and assertive way  Target Date: 08/04/24  Progress: progressing     Identify, challenge, and replace negative thought patterns that contribute to feelings of depression and feelings of hurt with positive thoughts, positive beliefs and positive self talk per patient's report Target Date: 08/04/24  Progress: progressing    Reduce overall level, frequency, and intensity of the feelings of depression as evidenced by decreased depressed mood, lack of energy, fatigue, loss of interest, decreased concentration, feelings of guilt, loses her train of thought during conversations, and difficulty recalling words in conversations/names from 2 days/week to 0 to 1 days/week per patient report for at least 3 consecutive months. Target Date: 08/04/24  Progress: progressing    Doree Barthel, LCSW

## 2024-02-15 NOTE — Progress Notes (Signed)
   April Barthel, LCSW

## 2024-02-29 ENCOUNTER — Encounter: Payer: Medicare HMO | Admitting: Clinical

## 2024-02-29 NOTE — Progress Notes (Signed)
 This encounter was created in error - please disregard.

## 2024-02-29 NOTE — Progress Notes (Deleted)
   April Barthel, LCSW

## 2024-03-07 ENCOUNTER — Ambulatory Visit: Payer: Medicare HMO | Admitting: Internal Medicine

## 2024-03-14 ENCOUNTER — Ambulatory Visit (INDEPENDENT_AMBULATORY_CARE_PROVIDER_SITE_OTHER): Payer: Medicare HMO | Admitting: Clinical

## 2024-03-14 DIAGNOSIS — F332 Major depressive disorder, recurrent severe without psychotic features: Secondary | ICD-10-CM | POA: Diagnosis not present

## 2024-03-14 NOTE — Progress Notes (Signed)
 [facility] Counselor/Therapist Progress Note  Patient ID: April Richardson, MRN: [id number],    Date: 03/14/2024  Time Spent: 8:34am - 9:22am : 48 minutes   Treatment Type: Individual Therapy  Reported Symptoms: Patient reported a recent improvement in mood  Mental Status Exam: Appearance:  Neat and Well Groomed     Behavior: Appropriate  Motor: Normal  Speech/Language:  Clear and Coherent and Normal Rate  Affect: Appropriate  Mood: normal  Thought process: normal  Thought content:   WNL  Sensory/Perceptual disturbances:   WNL  Orientation: oriented to person, place, and situation  Attention: Good  Concentration: Good  Memory: WNL  Fund of knowledge:  Good  Insight:   Good  Judgment:  Good  Impulse Control: Good   Risk Assessment: Danger to Self:  No  Patient denied current suicidal ideation  Self-injurious Behavior: No Danger to Others: No Patient denied current homicidal ideation Duty to Warn:no Physical Aggression / Violence:No  Access to Firearms a concern: No  Gang Involvement:No   Subjective: Patient stated, "much better" in response to patient's physical health since last session. Patient stated,  "I'm back working and doing things around here again" in response to events since last session.  Patient reported she has been picking up items in patient's room, putting dishes in the dishwasher, cooked several meals, and went shopping in [place]. Patient stated, "my mood has been much better". Patient stated, "Im back to watching TV, I don't watch a lot of political stuff". Patient reported she feels a decrease in television has benefited patient's mood. Patient stated, "I didn't realize how stressed I was until I got unstressed". Patient reported a decrease in time spent on social media and stated, "I think that has helped some too". Patient reported she continues to think about patient's son and grandchildren. Patient stated, "I think that might be  quite therapeutic" in response to writing a letter to patient's son. Patient reported she has been getting dressed daily, watching movies, and reading daily. Patient stated, "my mood is good today".  Interventions: Cognitive Behavioral Therapy. Clinician conducted session via caregility video from clinician's home office. Patient provided verbal consent to proceed with telehealth session and is aware of limitations of telephone or video visits. Patient participated in session from patient's home. Reviewed events since last session and assessed for changes. Assessed patient's activity level since last session and reviewed patient's activities. Explored and identified triggers for recent improvement in mood. Discussed patient's thoughts/feelings related to patient's son and grandchildren. Discussed patient writing a letter to patient's son to express patient's feelings/thoughts. Reviewed behavioral activation strategies/goals. Explored additional activities/goals to incorporate into patient's routine and increase patient's participation in activities patient enjoys, such as, going to a local bookstore, trying a new recipe weekly.  Clinician requested for homework patient develop a list of activities patient enjoys.    Collaboration of Care: Other not required at this time   Diagnosis:  Severe episode of recurrent major depressive disorder, without psychotic features (HCC)     Plan: Patient is to utilize [employer] Therapy, thought re-framing, communication skills, and coping strategies to decrease symptoms associated with their diagnosis. Frequency: bi-weekly  Modality: individual      Long-term goal:   Develop and verbally express an understanding of the impact patient's behaviors and verbal communication have on others and patient's relationships with others Target Date: 08/04/24  Progress: progressing      Short-term goal:  Develop, implement, and maintain healthy communication  strategies for patient  to utilize when expressing her thoughts and feelings to others in a controlled and assertive way  Target Date: 08/04/24  Progress: progressing    Identify, challenge, and replace negative thought patterns that contribute to feelings of depression and feelings of hurt with positive thoughts, positive beliefs and positive self talk per patient's report Target Date: 08/04/24  Progress: progressing    Reduce overall level, frequency, and intensity of the feelings of depression as evidenced by decreased depressed mood, lack of energy, fatigue, loss of interest, decreased concentration, feelings of guilt, loses her train of thought during conversations, and difficulty recalling words in conversations/names from 2 days/week to 0 to 1 days/week per patient report for at least 3 consecutive months. Target Date: 08/04/24  Progress: progressing     Doree Barthel, LCSW

## 2024-03-14 NOTE — Progress Notes (Signed)
   Doree Barthel, LCSW

## 2024-03-28 ENCOUNTER — Ambulatory Visit (INDEPENDENT_AMBULATORY_CARE_PROVIDER_SITE_OTHER): Admitting: Clinical

## 2024-03-28 DIAGNOSIS — F332 Major depressive disorder, recurrent severe without psychotic features: Secondary | ICD-10-CM

## 2024-03-28 NOTE — Progress Notes (Signed)
 [facility] Counselor/Therapist Progress Note  Patient ID: April Richardson, MRN: [id number],    Date: 03/28/2024  Time Spent: 8:30am - 8:54am : 24 minutes  Treatment Type: Individual Therapy  Reported Symptoms: unable to assess  Mental Status Exam: Appearance:  Unable to assess      Behavior: Unable to assess  Motor: Unable to assess  Speech/Language:  Clear and Coherent and Normal Rate  Affect: Unable to assess  Mood: Unable to assess  Thought process: normal  Thought content:   WNL  Sensory/Perceptual disturbances:   Unable to assess  Orientation: oriented to person, time/date, and situation  Attention: Good  Concentration: Good  Memory: WNL  Fund of knowledge:  Good  Insight:   Good  Judgment:  Good  Impulse Control: Good   Risk Assessment: Danger to Self:  Unable to assess Self-injurious Behavior: Unable to assess Danger to Others: Unable to assess Duty to Warn:Unable to assess Physical Aggression / Violence:Unable to assess Access to Firearms a concern: Unable to assess Gang Involvement:Unable to assess  Subjective: Patient reported patient received multiple links to today's session and has been logged onto caregility since 8:15am. Patient provided verbal consent to proceed via phone and [employer] teams if session is disconnected via phone. Patient provided verbal consent to speak via phone while daughter was present on the phone. Patient reported she experienced the same difficulty connecting via caregility once before and was unable to connect to the session at that time.   Interventions:  problem solving . Clinician attempted to conduct session via caregility video from clinician's home office. Clinician waited for patient to arrive in caregility session and patient reported she was logged into a different caregility session. Clinician spoke with patient via phone until phone call was disconnected. Patient attempted to connect with patient via  teams meeting and patient was unable to join to continue session. Patient provided verbal consent to proceed with telehealth session and is aware of limitations of telephone or video visits. Patient participated in session from patient's home.   Collaboration of Care: Other not required at this time   Diagnosis:  Severe episode of recurrent major depressive disorder, without psychotic features (HCC)     Plan: Patient is to utilize [employer] Therapy, thought re-framing, communication skills, and coping strategies to decrease symptoms associated with their diagnosis. Frequency: bi-weekly  Modality: individual      Long-term goal:   Develop and verbally express an understanding of the impact patient's behaviors and verbal communication have on others and patient's relationships with others Target Date: 08/04/24  Progress: progressing      Short-term goal:  Develop, implement, and maintain healthy communication strategies for patient to utilize when expressing her thoughts and feelings to others in a controlled and assertive way  Target Date: 08/04/24  Progress: progressing    Identify, challenge, and replace negative thought patterns that contribute to feelings of depression and feelings of hurt with positive thoughts, positive beliefs and positive self talk per patient's report Target Date: 08/04/24  Progress: progressing    Reduce overall level, frequency, and intensity of the feelings of depression as evidenced by decreased depressed mood, lack of energy, fatigue, loss of interest, decreased concentration, feelings of guilt, loses her train of thought during conversations, and difficulty recalling words in conversations/names from 2 days/week to 0 to 1 days/week per patient report for at least 3 consecutive months. Target Date: 08/04/24  Progress: progressing    Doree Barthel, LCSW

## 2024-03-28 NOTE — Progress Notes (Signed)
   April Barthel, LCSW
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
## 2024-04-05 ENCOUNTER — Other Ambulatory Visit: Payer: Self-pay

## 2024-04-05 ENCOUNTER — Encounter: Payer: Self-pay | Admitting: Psychiatry

## 2024-04-05 ENCOUNTER — Ambulatory Visit: Payer: Medicare HMO | Admitting: Psychiatry

## 2024-04-05 VITALS — BP 125/82 | HR 74 | Temp 97.0°F | Ht 62.0 in | Wt 179.6 lb

## 2024-04-05 DIAGNOSIS — R4189 Other symptoms and signs involving cognitive functions and awareness: Secondary | ICD-10-CM

## 2024-04-05 DIAGNOSIS — R251 Tremor, unspecified: Secondary | ICD-10-CM | POA: Diagnosis not present

## 2024-04-05 DIAGNOSIS — F3341 Major depressive disorder, recurrent, in partial remission: Secondary | ICD-10-CM

## 2024-04-05 MED ORDER — BUPROPION HCL 75 MG PO TABS: 75.0000 mg | ORAL_TABLET | Freq: Every day | ORAL | 0 refills | Status: DC

## 2024-04-05 MED ORDER — VENLAFAXINE HCL ER 150 MG PO CP24: 150.0000 mg | ORAL_CAPSULE | Freq: Every day | ORAL | 1 refills | Status: AC

## 2024-04-05 MED ORDER — VENLAFAXINE HCL ER 37.5 MG PO CP24: 37.5000 mg | ORAL_CAPSULE | Freq: Every day | ORAL | 1 refills | Status: AC

## 2024-04-05 NOTE — Patient Instructions (Signed)
 Continue venlafaxine 187.5 mg at night Continue bupropion 75 mg daily  Next appointment: 6/3 at 11 AM

## 2024-04-11 ENCOUNTER — Ambulatory Visit (INDEPENDENT_AMBULATORY_CARE_PROVIDER_SITE_OTHER): Admitting: Clinical

## 2024-04-11 DIAGNOSIS — F332 Major depressive disorder, recurrent severe without psychotic features: Secondary | ICD-10-CM
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
## 2024-04-11 NOTE — Progress Notes (Signed)
   April Barthel, LCSW

## 2024-04-29 ENCOUNTER — Encounter: Admitting: Clinical

## 2024-04-29 NOTE — Progress Notes (Signed)
 This encounter was created in error - please disregard.

## 2024-04-29 NOTE — Progress Notes (Signed)
   April Barthel, LCSW

## 2024-05-02 DIAGNOSIS — Z0389 Encounter for observation for other suspected diseases and conditions ruled out: Secondary | ICD-10-CM | POA: Diagnosis not present

## 2024-05-09 ENCOUNTER — Ambulatory Visit: Admitting: Clinical

## 2024-05-09 DIAGNOSIS — F332 Major depressive disorder, recurrent severe without psychotic features: Secondary | ICD-10-CM

## 2024-05-09 NOTE — Progress Notes (Signed)
   April Barthel, LCSW

## 2024-05-09 NOTE — Progress Notes (Signed)
 [facility] Counselor/Therapist Progress Note  Patient ID: April Richardson, MRN: [id number],    Date: 05/09/2024  Time Spent: 9:32 am - 10:20am : 48 minutes  Treatment Type: Individual Therapy  Reported Symptoms: none reported  Mental Status Exam: Appearance:  Neat and Well Groomed     Behavior: Appropriate  Motor: Normal  Speech/Language:  Clear and Coherent and Normal Rate  Affect: Appropriate  Mood: normal  Thought process: normal  Thought content:   WNL  Sensory/Perceptual disturbances:   WNL  Orientation: oriented to person, place, time/date, and situation  Attention: Good  Concentration: Good  Memory: WNL  Fund of knowledge:  Good  Insight:   Good  Judgment:  Good  Impulse Control: Good   Risk Assessment: Danger to Self:  No Patient denied current suicidal ideation  Self-injurious Behavior: No Danger to Others: No Patient denied current homicidal ideation Duty to Warn:no Physical Aggression / Violence:No  Access to Firearms a concern: No  Gang Involvement:No   Subjective: Patient stated, "sorry about the last one (appointment), I just totally forgot" in reference to patient's last therapy appointment. Patient stated, "I think well" in response to events since last session. Patient stated, "I've been trying to go out in the mornings and do some walking", "Ive been trying to get dressed and go for a walk".  Patient reported she has been trying to go for a walk every morning for the last week. Patient reported she got dressed each day last week. Patient stated, "I've done some shopping and got some plants I want to put out there" in reference to patient's shade garden. Patient stated, "I've got some things I'm looking forward to" and shared several activities patient is looking forward to in the future. Patient stated, "my mood is pretty good today". Patient stated, "I'm looking forward to today".  Patient reported she had an appointment with patient's  PCP recently. Patient reported PCP reviewed patient's medications and adjusted patient's medications/dosages. Patient stated, "I still get the shakes but not as bad", "it's better now". 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 Patient reported she would like to make a wall in patient's shade garden and plant in front of the wall and would like to make stepping stones for patient's shade garden. Patient reported she plans to trim the camellia plant in patient's yard this week and reported she enjoys trimming plants in patient's yard.    Interventions: Cognitive Behavioral Therapy. Clinician conducted session via caregility video from clinician's home office. Patient provided verbal consent to proceed with telehealth session and is aware of limitations of telephone or video visits. Patient participated in session from patient's home. Discussed recent missed appointment. Reviewed events since last session and assessed for changes. Reviewed behavioral activation steps previously discussed and the status of patient's progress since last session. Discussed patient's appointment with PCP regarding patient's concerns related to tremors in patient's hands and the outcome of the appointment. Explored and identified additional behavioral activation strategies to divide patient's goals related to patient's shade garden into smaller steps, such as, addressing one space at a time, dividing plants, creating one stepping stone at a time, and patient stated "one thing at a time". Discussed enjoyable activities patient plans to participate in this week.  Clinician requested for homework patient continue to practice behavioral activation steps.    Collaboration of Care: Other not required at this time   Diagnosis:  Severe episode of  recurrent major depressive disorder, without psychotic features (HCC)     Plan: Patient is to utilize [employer]  Therapy, thought re-framing, communication skills, and coping strategies to decrease symptoms associated with their diagnosis. Frequency: bi-weekly  Modality: individual      Long-term goal:   Develop and verbally express an understanding of the impact patient's behaviors and verbal communication have on others and patient's relationships with others Target Date: 08/04/24  Progress: progressing      Short-term goal:  Develop, implement, and maintain healthy communication strategies for patient to utilize when expressing her thoughts and feelings to others in a controlled and assertive way  Target Date: 08/04/24  Progress: progressing    Identify, challenge, and replace negative thought patterns that contribute to feelings of depression and feelings of hurt with positive thoughts, positive beliefs and positive self talk per patient's report Target Date: 08/04/24  Progress: progressing    Reduce overall level, frequency, and intensity of the feelings of depression as evidenced by decreased depressed mood, lack of energy, fatigue, loss of interest, decreased concentration, feelings of guilt, loses her train of thought during conversations, and difficulty recalling words in conversations/names from 2 days/week to 0 to 1 days/week per patient report for at least 3 consecutive months. Target Date: 08/04/24  Progress: progressing     April Carpen, LCSW
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
## 2024-05-21 ENCOUNTER — Other Ambulatory Visit: Payer: Self-pay

## 2024-05-21 ENCOUNTER — Emergency Department
Admission: EM | Admit: 2024-05-21 | Discharge: 2024-05-21 | Disposition: A | Discharge status: Home / Self Care | Attending: Emergency Medicine | Admitting: Emergency Medicine

## 2024-05-21 ENCOUNTER — Emergency Department

## 2024-05-21 DIAGNOSIS — W01198A Fall on same level from slipping, tripping and stumbling with subsequent striking against other object, initial encounter: Secondary | ICD-10-CM | POA: Diagnosis not present

## 2024-05-21 DIAGNOSIS — S0990XA Unspecified injury of head, initial encounter: Secondary | ICD-10-CM | POA: Diagnosis present

## 2024-05-21 DIAGNOSIS — S0181XA Laceration without foreign body of other part of head, initial encounter: Secondary | ICD-10-CM | POA: Diagnosis not present

## 2024-05-21 DIAGNOSIS — Z7982 Long term (current) use of aspirin: Secondary | ICD-10-CM | POA: Insufficient documentation

## 2024-05-21 DIAGNOSIS — Y92019 Unspecified place in single-family (private) house as the place of occurrence of the external cause: Secondary | ICD-10-CM | POA: Insufficient documentation

## 2024-05-21 DIAGNOSIS — W19XXXA Unspecified fall, initial encounter: Secondary | ICD-10-CM

## 2024-05-21 NOTE — ED Notes (Signed)
 Pt is CAOx4, breathing normally, and normal in color. Pt is complaining of head pain after a fall she had today. No LOC. Pt does have 2 small lacs in the center of forehead with bleeding controlled. Pt's family at bedside.

## 2024-05-21 NOTE — Discharge Instructions (Addendum)
 Please take Tylenol as needed for any headaches.  You may use heating pads and perform any gentle stretching exercises to help with muscular tightness and soreness.  You may allow Dermabond to get wet but do not submerge underwater.  Return to the ER for any severe headaches, vomiting, vision changes, redness, drainage from your laceration sites or any urgent changes in her health

## 2024-05-21 NOTE — ED Triage Notes (Signed)
 Pt reports multiple falls recently and a mechanical fall at home today causing lacerations to her forehead from her glasses. Pt has older hematomas on the back of her head from prior falls and reports a sever ear infection a few months ago that has lead to her having balance issues now.

## 2024-05-21 NOTE — ED Provider Notes (Signed)
 [place] [facility] Provider Note   CSN: [id number] Arrival date & time: 05/21/24  1216     History  Chief Complaint  Patient presents with  . Fall    April Richardson is a 73 y.o. female presents to the emergency department for evaluation of fall.  She suffered a mechanical fall at home after tripping, falling forward and hitting her head.  She was wearing glasses.  She has a couple of lacerations along her forehead.  Her tetanus is up-to-date from November 2024.  No LOC, nausea or vomiting.  She has had some balance issues in the past but is scheduled to see ENT.  Denies any dizziness or lightheadedness prior to this fall, states this was pure mechanical after tripping.  Patient denies any other injury to her body.  HPI     Home Medications Prior to Admission medications   Medication Sig Start Date End Date Taking? Authorizing Provider  amLODipine (NORVASC) 10 MG tablet Take 10 mg by mouth daily.    [provider]  aspirin EC 81 MG tablet Take 80 mg by mouth daily as needed.    [provider]  buPROPion  (WELLBUTRIN ) 75 MG tablet Take 1 tablet (75 mg total) by mouth daily. 05/09/24 08/07/24  Todd Fossa, MD  carvedilol (COREG) 25 MG tablet Take 25 mg by mouth 2 (two) times daily.    [provider]  Cholecalciferol (VITAMIN D-1000 MAX ST) 25 MCG (1000 UT) tablet Take 5,000 Units by mouth daily.    [provider]  ezetimibe (ZETIA) 10 MG tablet Take 10 mg by mouth daily. 07/31/19   [provider]  fenofibrate (TRICOR) 145 MG tablet Take 145 mg by mouth daily.    [provider]  FERREX 150 150 MG capsule Take 150 mg by mouth daily. 10/14/19   [provider]  metFORMIN (GLUCOPHAGE) 500 MG tablet Take 1,000 mg by mouth 2 (two) times daily with a meal.     [provider]  Semaglutide (OZEMPIC, 0.25 OR 0.5 MG/DOSE, ) Inject into the skin.    [provider]   valsartan (DIOVAN) 80 MG tablet Take 80 mg by mouth daily.  07/31/19   [provider]  venlafaxine  XR (EFFEXOR -XR) 150 MG 24 hr capsule Take 1 capsule (150 mg total) by mouth daily with breakfast. Take a total of 187.5 mg daily. Take along with 37.5 mg cap 05/11/24 11/07/24  Todd Fossa, MD  venlafaxine  XR (EFFEXOR -XR) 37.5 MG 24 hr capsule Take 1 capsule (37.5 mg total) by mouth daily. Take a total of 187.5 mg daily. Take along with 150 mg cap 05/16/24 11/12/24  Todd Fossa, MD      Allergies    Patient has no known allergies.    Review of Systems   Review of Systems  Physical Exam Updated Vital Signs BP 123/74 (BP Location: Right Arm)   Pulse 61   Temp 98.1 F (36.7 C) (Oral)   Resp 16   Ht 5\' 2"  (1.575 m)   Wt 79.4 kg   SpO2 100%   BMI 32.01 kg/m  Physical Exam Constitutional:      Appearance: She is well-developed.  HENT:     Head: Normocephalic.     Comments: 2 lacerations forehead, first one is 3 cm and second 1 is 2 cm.  Both are linear.  No active bleeding.  Both were cleansed thoroughly and no visible or palpable foreign body    Right Ear: Ear  canal normal.     Left Ear: Ear canal normal.     Mouth/Throat:     Mouth: Mucous membranes are moist.     Pharynx: Oropharynx is clear. No oropharyngeal exudate or posterior oropharyngeal erythema.  Eyes:     Conjunctiva/sclera: Conjunctivae normal.  Cardiovascular:     Rate and Rhythm: Normal rate.     Pulses: Normal pulses.     Heart sounds: Normal heart sounds.  Pulmonary:     Effort: Pulmonary effort is normal. No respiratory distress.  Abdominal:     General: Abdomen is flat. Bowel sounds are normal.  Musculoskeletal:        General: Normal range of motion.     Cervical back: Normal range of motion.  Skin:    General: Skin is warm.     Capillary Refill: Capillary refill takes less than 2 seconds.     Findings: No rash.  Neurological:     General: No focal deficit present.     Mental Status: She is  alert and oriented to person, place, and time. Mental status is at baseline.     Cranial Nerves: No cranial nerve deficit.  Psychiatric:        Mood and Affect: Mood normal.        Behavior: Behavior normal.        Thought Content: Thought content normal.        Judgment: Judgment normal.    ED Results / Procedures / Treatments   Labs (all labs ordered are listed, but only abnormal results are displayed) Labs Reviewed - No data to display  EKG None  Radiology CT Head Wo Contrast Result Date: 05/21/2024 CLINICAL DATA:  Fall at home. Blunt head trauma. Headache and scalp hematoma. EXAM: CT HEAD WITHOUT CONTRAST TECHNIQUE: Contiguous axial images were obtained from the base of the skull through the vertex without intravenous contrast. RADIATION DOSE REDUCTION: This exam was performed according to the departmental dose-optimization program which includes automated exposure control, adjustment of the mA and/or kV according to patient size and/or use of iterative reconstruction technique. COMPARISON:  11/06/2023 FINDINGS: Brain: No evidence of intracranial hemorrhage, acute infarction, hydrocephalus, extra-axial collection, or mass lesion/mass effect. Vascular:  No hyperdense vessel or other acute findings. Skull: No evidence of fracture or other significant bone abnormality. Sinuses/Orbits:  No acute findings. Other: None. IMPRESSION: Negative noncontrast head CT. Electronically Signed   By: Marlyce Sine M.D.   On: 05/21/2024 12:50    Procedures .Laceration Repair  Date/Time: 05/21/2024 2:04 PM  Performed by: Coralyn Derry, PA-C Authorized by: Coralyn Derry, PA-C   Consent:    Consent obtained:  Verbal   Consent given by:  Patient Universal protocol:    Patient identity confirmed:  Verbally with patient Laceration details:    Location:  Face   Face location:  Forehead   Length (cm):  5 (5)   Depth (mm):  3 Treatment:    Area cleansed with:  Povidone-iodine and saline    Irrigation method:  Pressure wash Skin repair:    Repair method:  Tissue adhesive Approximation:    Approximation:  Close Repair type:    Repair type:  Simple Post-procedure details:    Dressing:  Open (no dressing)     Medications Ordered in ED Medications - No data to display  ED Course/ Medical Decision Making/ A&P  Medical Decision Making Amount and/or Complexity of Data Reviewed Radiology: ordered.   73 year old female with laceration to the forehead from a fall earlier today.  No preceding chest pain shortness of breath dizziness or lightheadedness.  Fall is mechanical.  She suffered a laceration to the forehead x 2.  CT of the head negative for any acute intracranial process.  She appears well no other injuries to her body.  She is educated on wound care, head injuries and mild concussion protocols.  She understands signs symptoms return to the ED for. Final Clinical Impression(s) / ED Diagnoses Final diagnoses:  Fall, initial encounter  Injury of head, initial encounter  Facial laceration, initial encounter    Rx / DC Orders ED Discharge Orders     None         Orysia Blas 05/21/24 1405    Jacquie Maudlin, MD 05/21/24 [phone]

## 2024-05-23 ENCOUNTER — Encounter: Admitting: Clinical

## 2024-05-23 NOTE — Progress Notes (Deleted)
   April Barthel, LCSW

## 2024-05-23 NOTE — Progress Notes (Deleted)
 [facility] Counselor/Therapist Progress Note  Patient ID: April Richardson, MRN: [id number],    Date: 05/23/2024  Time Spent: ***   Treatment Type: Individual Therapy  Reported Symptoms: ***  Mental Status Exam: Appearance:  {PSY:22683}     Behavior: {PSY:21022743}  Motor: {PSY:22302}  Speech/Language:  {PSY:22685}  Affect: {PSY:22687}  Mood: {PSY:31886}  Thought process: {PSY:31888}  Thought content:   {PSY:5852701761}  Sensory/Perceptual disturbances:   {[phone]}  Orientation: {PSY:30297}  Attention: {PSY:22877}  Concentration: {PSY:8563972278}  Memory: {PSY:[phone]}  Fund of knowledge:  {PSY:8563972278}  Insight:   {PSY:8563972278}  Judgment:  {PSY:8563972278}  Impulse Control: {PSY:8563972278}   Risk Assessment: Danger to Self:  {PSY:22692} Self-injurious Behavior: No Danger to Others: {PSY:22692} Duty to Warn:no Physical Aggression / Violence:No  Access to Firearms a concern: No  Gang Involvement:No   Subjective: ***   Interventions: {[phone]}. Clinician conducted session via caregility video from clinician's home office. Patient provided verbal consent to proceed with telehealth session and is aware of limitations of telephone or video visits. Patient participated in session from patient's home.  Reviewed events since last session and assessed for changes.   Last - Clinician conducted session via caregility video from clinician's home office. Patient provided verbal consent to proceed with telehealth session and is aware of limitations of telephone or video visits. Patient participated in session from patient's home. Discussed recent missed appointment. Reviewed events since last session and assessed for changes. Reviewed behavioral activation steps previously discussed and the status of patient's progress since last session. Discussed patient's appointment with PCP regarding patient's concerns related to tremors in patient's  hands and the outcome of the appointment. Explored and identified additional behavioral activation strategies to divide patient's goals related to patient's shade garden into smaller steps, such as, addressing one space at a time, dividing plants, creating one stepping stone at a time, and patient stated "one thing at a time". Discussed enjoyable activities patient plans to participate in this week.  Clinician requested for homework patient continue to practice behavioral activation steps.    Collaboration of Care: Other not required at this time   Diagnosis:  Severe episode of recurrent major depressive disorder, without psychotic features (HCC)     Plan: Patient is to utilize [employer] Therapy, thought re-framing, communication skills, and coping strategies to decrease symptoms associated with their diagnosis. Frequency: bi-weekly  Modality: individual      Long-term goal:   Develop and verbally express an understanding of the impact patient's behaviors and verbal communication have on others and patient's relationships with others Target Date: 08/04/24  Progress: progressing      Short-term goal:  Develop, implement, and maintain healthy communication strategies for patient to utilize when expressing her thoughts and feelings to others in a controlled and assertive way  Target Date: 08/04/24  Progress: progressing    Identify, challenge, and replace negative thought patterns that contribute to feelings of depression and feelings of hurt with positive thoughts, positive beliefs and positive self talk per patient's report Target Date: 08/04/24  Progress: progressing    Reduce overall level, frequency, and intensity of the feelings of depression as evidenced by decreased depressed mood, lack of energy, fatigue, loss of interest, decreased concentration, feelings of guilt, loses her train of thought during conversations, and difficulty recalling words in conversations/names from 2  days/week to 0 to 1 days/week per patient report for at least 3 consecutive months. Target Date: 08/04/24  Progress: progressing       Burlene Carpen, LCSW

## 2024-05-23 NOTE — Progress Notes (Signed)
 This encounter was created in error - please disregard.

## 2024-05-24 ENCOUNTER — Ambulatory Visit: Admitting: Psychiatry

## 2024-06-03 ENCOUNTER — Encounter: Payer: Self-pay | Admitting: Sleep Medicine

## 2024-06-03 ENCOUNTER — Ambulatory Visit: Admitting: Sleep Medicine

## 2024-06-03 VITALS — BP 130/80 | HR 76 | Temp 98.7°F | Ht 62.0 in | Wt 173.8 lb

## 2024-06-03 DIAGNOSIS — I10 Essential (primary) hypertension: Secondary | ICD-10-CM | POA: Diagnosis not present

## 2024-06-03 DIAGNOSIS — G4733 Obstructive sleep apnea (adult) (pediatric): Secondary | ICD-10-CM
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
## 2024-06-03 NOTE — Progress Notes (Signed)
 Name:April Richardson MRN: [id number] DOB: [birth year]   CHIEF COMPLAINT:  HST F/U   HISTORY OF PRESENT ILLNESS:  April Richardson is a 73 y.o. w/ a h/o OSA, HTN, obesity, anxiety, depression and DMII who presents to follow up on HST results. The patient was diagnosed with OSA several years ago and subsequently started on CPAP therapy. States that she did not tolerate CPAP mask and discontinued use. She was recently evaluated by Dr. Auston Left and underwent HST, which did not reveal significant sleep disordered breathing. Patient reports nocturnal awakenings due to nocturia, however does not have difficulty falling back to sleep. Reports a 50 lb weight loss over the last year. Admits to dry mouth and night sweats. Denies RLS symptoms, dream enactment, cataplexy, hypnagogic or hypnapompic hallucinations. Reports a family history of sleep apnea. Denies drowsy driving. Drinks 1 cup of coffee daily, denies alcohol, tobacco or illicit drug use. States that she naps daily for 1-2 hours in the afternoon.   Bedtime 11 pm Sleep onset 1 hour  Rise time 5:30-6 am   EPWORTH SLEEP SCORE    02/01/2024    9:55 AM  Results of the Epworth flowsheet  Sitting and reading 1  Watching TV 1  Sitting, inactive in a public place (e.g. a theatre or a meeting) 1  As a passenger in a car for an hour without a break 2  Lying down to rest in the afternoon when circumstances permit 3  Sitting and talking to someone 0  Sitting quietly after a lunch without alcohol 2  In a car, while stopped for a few minutes in traffic 0  Total score 10     PAST MEDICAL HISTORY :   has a past medical history of Arthritis, Depression, Diabetes mellitus without complication (HCC), Hyperlipidemia, and Hypertension.  has a past surgical history that includes Cesarean section; Abdominal hysterectomy; and Back surgery. Prior to Admission medications   Medication Sig Start Date End Date Taking? Authorizing Provider   amLODipine (NORVASC) 10 MG tablet Take 10 mg by mouth daily.   Yes [provider]  aspirin EC 81 MG tablet Take 80 mg by mouth daily as needed.   Yes [provider]  buPROPion  (WELLBUTRIN ) 75 MG tablet Take 1 tablet (75 mg total) by mouth daily. 05/09/24 08/07/24 Yes Todd Fossa, MD  carvedilol (COREG) 12.5 MG tablet Take 12.5 mg by mouth 2 (two) times daily with a meal. 04/11/24 04/11/25 Yes [provider]  Cholecalciferol (VITAMIN D-1000 MAX ST) 25 MCG (1000 UT) tablet Take 5,000 Units by mouth daily.   Yes [provider]  cyanocobalamin (VITAMIN B12) 1000 MCG tablet Take 1,000 mcg by mouth daily. 04/11/24 04/11/25 Yes [provider]  ezetimibe (ZETIA) 10 MG tablet Take 10 mg by mouth daily. 07/31/19  Yes [provider]  fenofibrate (TRICOR) 145 MG tablet Take 145 mg by mouth daily.   Yes [provider]  FERREX 150 150 MG capsule Take 150 mg by mouth daily. 10/14/19  Yes [provider]  metFORMIN (GLUCOPHAGE) 500 MG tablet Take 500 mg by mouth 2 (two) times daily with a meal. 04/11/24 04/11/25 Yes [provider]  Semaglutide (OZEMPIC, 0.25 OR 0.5 MG/DOSE, [place]) Inject into the skin.   Yes [provider]  valsartan (DIOVAN) 40 MG tablet Take 40 mg by mouth daily. 04/11/24 04/11/25 Yes [provider]  valsartan-hydrochlorothiazide (DIOVAN-HCT) 80-12.5 MG tablet Take 1 tablet by mouth daily. 04/11/24  Yes [provider]  venlafaxine  XR (EFFEXOR -XR) 150 MG 24 hr capsule Take 1 capsule (150 mg total) by mouth daily with breakfast. Take a total of 187.5 mg daily. Take along with 37.5 mg cap 05/11/24 11/07/24 Yes Hisada, Ivan Marion, MD  venlafaxine  XR (EFFEXOR -XR) 37.5 MG 24 hr capsule Take 1 capsule (37.5 mg total) by mouth daily. Take a total of 187.5 mg daily. Take along with 150 mg cap 05/16/24 11/12/24 Yes Hisada, Ivan Marion, MD   No Known Allergies  FAMILY HISTORY:  family history includes Diabetes in  her mother. SOCIAL HISTORY:  reports that she quit smoking about 15 years ago. Her smoking use included cigarettes. She started smoking about 30 years ago. She has a 15 pack-year smoking history. She has never used smokeless tobacco. She reports that she does not currently use alcohol. She reports current drug use.   Review of Systems:  Gen:  Denies  fever, sweats, chills weight loss  HEENT: Denies blurred vision, double vision, ear pain, eye pain, hearing loss, nose bleeds, sore throat Cardiac:  No dizziness, chest pain or heaviness, chest tightness,edema, No JVD Resp:   No cough, -sputum production, -shortness of breath,-wheezing, -hemoptysis,  Gi: Denies swallowing difficulty, stomach pain, nausea or vomiting, diarrhea, constipation, bowel incontinence Gu:  Denies bladder incontinence, burning urine Ext:   Denies Joint pain, stiffness or swelling Skin: Denies  skin rash, easy bruising or bleeding or hives Endoc:  Denies polyuria, polydipsia , polyphagia or weight change Psych:   Denies depression, insomnia or hallucinations  Other:  All other systems negative  VITAL SIGNS: BP 130/80 (BP Location: Right Arm, Patient Position: Sitting, Cuff Size: Large)   Pulse 76   Temp 98.7 F (37.1 C) (Oral)   Ht 5' 2 (1.575 m)   Wt 173 lb 12.8 oz (78.8 kg)   SpO2 96%   BMI 31.79 kg/m    Physical Examination:   General Appearance: No distress  EYES PERRLA, EOM intact.   NECK Supple, No JVD Pulmonary: normal breath sounds, No wheezing.  CardiovascularNormal S1,S2.  No m/r/g.   Abdomen: Benign, Soft, non-tender. Skin:   warm, no rashes, no ecchymosis  Extremities: normal, no cyanosis, clubbing. Neuro:without focal findings,  speech normal  PSYCHIATRIC: Mood, affect within normal limits.   ASSESSMENT AND PLAN  OSA I suspect that HST yielded a false negative result. Due to clinical history, recommend further evaluation with in lab PSG. Discussed the consequences of untreated sleep  apnea. Advised not to drive drowsy for safety of patient and others. Patient would like to wait to complete in lab sleep study at this point. States that she will call back to let us  know how she will proceed.     HTN Stable, on current management. Following with PCP.    Patient  satisfied with Plan of action and management. All questions answered  I spent a total of 34 minutes reviewing chart data, face-to-face evaluation with the patient, counseling and coordination of care as detailed above.    Mersadie Kavanaugh, M.D.  Sleep Medicine [facility]

## 2024-06-06 ENCOUNTER — Encounter: Payer: Self-pay | Admitting: Psychiatry

## 2024-06-06 ENCOUNTER — Ambulatory Visit (INDEPENDENT_AMBULATORY_CARE_PROVIDER_SITE_OTHER): Admitting: Psychiatry

## 2024-06-06 VITALS — BP 120/78 | HR 81 | Temp 98.6°F | Ht 62.0 in | Wt 174.2 lb

## 2024-06-06 DIAGNOSIS — R4189 Other symptoms and signs involving cognitive functions and awareness: Secondary | ICD-10-CM | POA: Diagnosis not present

## 2024-06-06 DIAGNOSIS — F3341 Major depressive disorder, recurrent, in partial remission: Secondary | ICD-10-CM

## 2024-07-13 ENCOUNTER — Other Ambulatory Visit: Payer: Self-pay | Admitting: Psychiatry

## 2024-07-28 NOTE — Progress Notes (Signed)
 No show

## 2024-08-01 ENCOUNTER — Ambulatory Visit (INDEPENDENT_AMBULATORY_CARE_PROVIDER_SITE_OTHER): Admitting: Psychiatry

## 2024-08-01 DIAGNOSIS — Z91199 Patient's noncompliance with other medical treatment and regimen due to unspecified reason: Secondary | ICD-10-CM

## 2024-08-22 ENCOUNTER — Ambulatory Visit: Payer: Self-pay | Admitting: Internal Medicine

## 2024-10-11 ENCOUNTER — Other Ambulatory Visit: Payer: Self-pay | Admitting: Psychiatry

## 2024-10-11 NOTE — Telephone Encounter (Signed)
 Could you contact her/daughter to have follow up if interested? Thanks.

## 2024-10-11 NOTE — Telephone Encounter (Signed)
 Got it. Could you also send my chart message? I believe her daughter checks there as well. Thanks.

## 2024-11-15 ENCOUNTER — Other Ambulatory Visit: Payer: Self-pay | Admitting: Psychiatry

## 2025-01-25 ENCOUNTER — Other Ambulatory Visit: Payer: Self-pay | Admitting: Psychiatry
